# Patient Record
Sex: Female | Born: 1970 | Race: White | Hispanic: No | Marital: Married | Smoking: Never smoker
Health system: Southern US, Community
[De-identification: ages and names within clinical notes are randomized; demographics above are authoritative.]

## PROBLEM LIST (undated history)

## (undated) DIAGNOSIS — I499 Cardiac arrhythmia, unspecified: Secondary | ICD-10-CM

## (undated) DIAGNOSIS — K5790 Diverticulosis of intestine, part unspecified, without perforation or abscess without bleeding: Secondary | ICD-10-CM

## (undated) DIAGNOSIS — I10 Essential (primary) hypertension: Secondary | ICD-10-CM

## (undated) HISTORY — PX: WISDOM TOOTH EXTRACTION: SHX21

## (undated) HISTORY — DX: Diverticulosis of intestine, part unspecified, without perforation or abscess without bleeding: K57.90

---
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
## 1999-08-17 ENCOUNTER — Emergency Department (HOSPITAL_COMMUNITY): Admission: EM | Admit: 1999-08-17 | Discharge: 1999-08-17 | Payer: Self-pay | Admitting: Emergency Medicine

## 2000-02-22 DIAGNOSIS — I10 Essential (primary) hypertension: Secondary | ICD-10-CM

## 2000-02-22 HISTORY — DX: Essential (primary) hypertension: I10

## 2000-03-30 ENCOUNTER — Encounter: Payer: Self-pay | Admitting: Obstetrics & Gynecology

## 2000-03-30 ENCOUNTER — Encounter: Admission: RE | Admit: 2000-03-30 | Discharge: 2000-03-30 | Payer: Self-pay | Admitting: Obstetrics & Gynecology

## 2000-06-02 ENCOUNTER — Inpatient Hospital Stay (HOSPITAL_COMMUNITY): Admission: AD | Admit: 2000-06-02 | Discharge: 2000-06-04 | Payer: Self-pay | Admitting: Obstetrics and Gynecology

## 2000-07-19 ENCOUNTER — Other Ambulatory Visit: Admission: RE | Admit: 2000-07-19 | Discharge: 2000-07-19 | Payer: Self-pay | Admitting: Obstetrics & Gynecology

## 2001-08-14 ENCOUNTER — Other Ambulatory Visit: Admission: RE | Admit: 2001-08-14 | Discharge: 2001-08-14 | Payer: Self-pay | Admitting: Obstetrics & Gynecology

## 2001-08-17 ENCOUNTER — Encounter: Admission: RE | Admit: 2001-08-17 | Discharge: 2001-08-17 | Payer: Self-pay | Admitting: Obstetrics & Gynecology

## 2001-08-17 ENCOUNTER — Encounter: Payer: Self-pay | Admitting: Obstetrics & Gynecology

## 2001-12-03 ENCOUNTER — Other Ambulatory Visit: Admission: RE | Admit: 2001-12-03 | Discharge: 2001-12-03 | Payer: Self-pay | Admitting: Obstetrics & Gynecology

## 2002-06-14 ENCOUNTER — Inpatient Hospital Stay (HOSPITAL_COMMUNITY): Admission: AD | Admit: 2002-06-14 | Discharge: 2002-06-16 | Payer: Self-pay | Admitting: Obstetrics & Gynecology

## 2002-07-23 ENCOUNTER — Other Ambulatory Visit: Admission: RE | Admit: 2002-07-23 | Discharge: 2002-07-23 | Payer: Self-pay | Admitting: Obstetrics & Gynecology

## 2003-01-01 ENCOUNTER — Other Ambulatory Visit: Admission: RE | Admit: 2003-01-01 | Discharge: 2003-01-01 | Payer: Self-pay | Admitting: Obstetrics & Gynecology

## 2003-03-12 ENCOUNTER — Other Ambulatory Visit: Admission: RE | Admit: 2003-03-12 | Discharge: 2003-03-12 | Payer: Self-pay | Admitting: Obstetrics & Gynecology

## 2003-07-18 ENCOUNTER — Other Ambulatory Visit: Admission: RE | Admit: 2003-07-18 | Discharge: 2003-07-18 | Payer: Self-pay | Admitting: Obstetrics & Gynecology

## 2003-12-16 ENCOUNTER — Other Ambulatory Visit: Admission: RE | Admit: 2003-12-16 | Discharge: 2003-12-16 | Payer: Self-pay | Admitting: Obstetrics & Gynecology

## 2004-05-26 ENCOUNTER — Other Ambulatory Visit: Admission: RE | Admit: 2004-05-26 | Discharge: 2004-05-26 | Payer: Self-pay | Admitting: Obstetrics & Gynecology

## 2004-11-17 ENCOUNTER — Other Ambulatory Visit: Admission: RE | Admit: 2004-11-17 | Discharge: 2004-11-17 | Payer: Self-pay | Admitting: Obstetrics & Gynecology

## 2005-04-28 ENCOUNTER — Other Ambulatory Visit: Admission: RE | Admit: 2005-04-28 | Discharge: 2005-04-28 | Payer: Self-pay | Admitting: Obstetrics & Gynecology

## 2005-11-14 ENCOUNTER — Inpatient Hospital Stay (HOSPITAL_COMMUNITY): Admission: AD | Admit: 2005-11-14 | Discharge: 2005-11-16 | Payer: Self-pay | Admitting: Obstetrics and Gynecology

## 2014-04-18 ENCOUNTER — Ambulatory Visit (INDEPENDENT_AMBULATORY_CARE_PROVIDER_SITE_OTHER): Payer: 59 | Admitting: Internal Medicine

## 2014-04-18 VITALS — BP 110/68 | HR 50 | Temp 98.0°F | Resp 18 | Ht 64.5 in | Wt 135.0 lb

## 2014-04-18 DIAGNOSIS — J988 Other specified respiratory disorders: Secondary | ICD-10-CM

## 2014-04-18 DIAGNOSIS — J22 Unspecified acute lower respiratory infection: Secondary | ICD-10-CM

## 2014-04-18 MED ORDER — ALBUTEROL SULFATE HFA 108 (90 BASE) MCG/ACT IN AERS: 2.0000 | INHALATION_SPRAY | Freq: Four times a day (QID) | RESPIRATORY_TRACT | Status: DC | PRN

## 2014-04-18 MED ORDER — AZITHROMYCIN 250 MG PO TABS: ORAL_TABLET | ORAL | Status: DC

## 2014-04-18 MED ORDER — HYDROCODONE-HOMATROPINE 5-1.5 MG/5ML PO SYRP: 5.0000 mL | ORAL_SOLUTION | Freq: Four times a day (QID) | ORAL | Status: DC | PRN

## 2014-04-18 NOTE — Progress Notes (Signed)
   Subjective:    Patient ID: Linda Huff, female    DOB: 1970-10-09, 44 y.o.   MRN: [id number] This chart was scribed for Ellamae Siaobert Jeselle Hiser, MD by Jolene Provostobert Halas, Medical Scribe. This patient was seen in Room 2 and the patient's care was started a 2:51 PM.  Chief Complaint  Patient presents with  . Cough    x1 week   . Nasal Congestion  . Fever  . Generalized Body Aches    HPI HPI Comments: Linda Huff is a 44 y.o. female who [facility] complaining of a productive cough for the last week with associated chest congestion, fever, chills, sick contacts, diaphoresis and body aches. Running has not induced her to cough. Pt states her cough wakes her up at night.  Pt states she is training for a half marathon. Mid-March.  No history of asthma   Pt recently started working at a middle school.   Review of Systems  Constitutional: Positive for fever, chills and diaphoresis.  HENT: Positive for congestion.   Respiratory: Positive for cough.   Psychiatric/Behavioral: Positive for sleep disturbance.       Objective:   Physical Exam  Constitutional: She is oriented to person, place, and time. She appears well-developed and well-nourished. No distress.  HENT:  Head: Normocephalic and atraumatic.  Mouth/Throat: Oropharynx is clear and moist.  Eyes: Pupils are equal, round, and reactive to light.  Neck: Neck supple.  Cardiovascular: Normal rate and normal heart sounds.   Pulmonary/Chest: Effort normal. She has no wheezes.  Crackles at both bases that clear with deep breathing. No wheezing with forced expiration.   Musculoskeletal: Normal range of motion.  Neurological: She is alert and oriented to person, place, and time. Coordination normal.  Skin: Skin is warm and dry. She is not diaphoretic.  Psychiatric: She has a normal mood and affect. Her behavior is normal.  Nursing note and vitals reviewed.      Assessment & Plan:  LRI w/ RAD  Meds ordered this encounter    Medications  . azithromycin (ZITHROMAX) 250 MG tablet    Sig: As packaged    Dispense:  6 tablet    Refill:  0  . HYDROcodone-homatropine (HYCODAN) 5-1.5 MG/5ML syrup    Sig: Take 5 mLs by mouth every 6 (six) hours as needed.    Dispense:  120 mL    Refill:  0  . albuterol (PROVENTIL HFA;VENTOLIN HFA) 108 (90 BASE) MCG/ACT inhaler    Sig: Inhale 2 puffs into the lungs every 6 (six) hours as needed for wheezing or shortness of breath.    Dispense:  1 Inhaler    Refill:  0     I have completed the patient encounter in its entirety as documented by the scribe, with editing by me where necessary. Dorna Mallet P. Merla Richesoolittle, M.D.

## 2014-06-17 DIAGNOSIS — R001 Bradycardia, unspecified: Secondary | ICD-10-CM | POA: Insufficient documentation

## 2014-06-25 ENCOUNTER — Telehealth: Payer: Self-pay | Admitting: Cardiology

## 2014-06-25 NOTE — Telephone Encounter (Signed)
[facility] appointment on 08/07/14 with Dr Antoine PocheHochrein.  [facility] for Dr Hochrein's schedule on 08/07/14. lp

## 2014-07-22 ENCOUNTER — Ambulatory Visit: Payer: 59

## 2014-07-22 ENCOUNTER — Ambulatory Visit (INDEPENDENT_AMBULATORY_CARE_PROVIDER_SITE_OTHER): Payer: 59 | Admitting: Internal Medicine

## 2014-07-22 VITALS — BP 114/64 | HR 66 | Temp 97.0°F | Resp 16 | Ht 64.0 in | Wt 135.8 lb

## 2014-07-22 DIAGNOSIS — M25572 Pain in left ankle and joints of left foot: Secondary | ICD-10-CM

## 2014-07-22 NOTE — Progress Notes (Signed)
   Subjective:    Patient ID: Linda Huff, female    DOB: 02/11/1971, 44 y.o.   MRN: [id number] This chart was scribed for Linda Siaobert Jeovany Huitron, MD by Linda Huff, Medical Scribe. This patient was seen in Room 1 and the patient's care was started a 2:30 PM.  Chief Complaint  Patient presents with  . Foot Injury    x 5 days, Happened while running, started hurting after jog    HPI HPI Comments: Linda Huff is a 44 y.o. female who [facility] complaining of left foot pain on the lateral side that started while she was running five days ago. Pt has no pain while she is sleeping, but as soon as she puts pressure on it she feels pain. Pt denies doing any excessive activity in the three days preceding the onset of her pain.     Review of Systems  Constitutional: Negative for fever and chills.  Musculoskeletal: Positive for gait problem. Negative for joint swelling.  Skin: Negative for color change and wound.       Objective:   Physical Exam  Constitutional: She is oriented to person, place, and time. She appears well-developed and well-nourished. No distress.  HENT:  Head: Normocephalic and atraumatic.  Eyes: Pupils are equal, round, and reactive to light.  Neck: Neck supple.  Cardiovascular: Normal rate.   Pulmonary/Chest: Effort normal. No respiratory distress.  Musculoskeletal: Normal range of motion. She exhibits tenderness. She exhibits no edema.  Tender over the left lateral dorsal forefoot.   Neurological: She is alert and oriented to person, place, and time. Coordination normal.  Skin: Skin is warm and dry. She is not diaphoretic.  Psychiatric: She has a normal mood and affect. Her behavior is normal.  Nursing note and vitals reviewed.      Assessment & Plan:  Diagnosis probable peroneal tendinitis but ordered x-ray to rule out stress fracture due to distance running   Patient left before x-ray called her I asked the staff to call with instructions for  treatment of perineal tendinitis and follow-up in 2-3 weeks if not well but since she left without the conclusion I will not submit an office charge I have completed the patient encounter in its entirety as documented by the scribe, with editing by me where necessary. Aizlynn Digilio P. Merla Richesoolittle, M.D.

## 2014-08-07 ENCOUNTER — Ambulatory Visit: Payer: Self-pay | Admitting: Cardiology

## 2014-09-12 ENCOUNTER — Encounter: Payer: Self-pay | Admitting: Cardiology

## 2014-09-12 ENCOUNTER — Ambulatory Visit (INDEPENDENT_AMBULATORY_CARE_PROVIDER_SITE_OTHER): Payer: 59 | Admitting: Cardiology

## 2014-09-12 VITALS — BP 108/72 | HR 45 | Ht 64.0 in | Wt 136.0 lb

## 2014-09-12 DIAGNOSIS — R001 Bradycardia, unspecified: Secondary | ICD-10-CM | POA: Insufficient documentation

## 2014-09-12 DIAGNOSIS — I499 Cardiac arrhythmia, unspecified: Secondary | ICD-10-CM

## 2014-09-12 NOTE — Patient Instructions (Signed)
Your physician recommends that you schedule a follow-up appointment in: As Needed    

## 2014-09-12 NOTE — Progress Notes (Signed)
Cardiology Office Note   Date:  09/12/2014   ID:  [name], DOB 1971-01-18, MRN [id number]  PCP:  Herb Grays, MD  Cardiologist:   Rollene Rotunda, MD   Chief Complaint  Patient presents with  . Bradycardia      History of Present Illness: Linda Huff is a 44 y.o. female who presents for evaluation of slow heart rate and arrhythmia. The patient has had no cardiac history. She exercises routinely. She can run and play tennis without any cardiovascular symptoms. She denies any presyncope or syncope. She has no chest pressure, neck or arm discomfort. She's had no weight gain or edema. She has been noted to have slow heart rhythm and occasional "skipped beats". However, her heart rate goes up appropriately with exercise. She doesn't feel any palpitations.   PMH None  Past Surgical History  Procedure Laterality Date  . Wisdom tooth extraction       No current outpatient prescriptions on file.   No current facility-administered medications for this visit.    Allergies:   Review of patient's allergies indicates no known allergies.    Social History:  The patient  reports that she has never smoked. She does not have any smokeless tobacco history on file. She reports that she drinks about 1.8 oz of alcohol per week. She reports that she does not use illicit drugs.   Family History:  The patient's family history includes Alcohol abuse in her father; Hyperlipidemia in her mother; Lung cancer in her paternal grandfather; Stomach cancer in her maternal grandfather.    ROS:  Please see the history of present illness.   Otherwise, review of systems are positive for none.   All other systems are reviewed and negative.    PHYSICAL EXAM: VS:  BP 108/72 mmHg  Pulse 45  Ht  (1.626 m)  Wt 136 lb (61.689 kg)  BMI 23.33 kg/m2 , BMI Body mass index is 23.33 kg/(m^2). GENERAL:  Well appearing HEENT:  Pupils equal round and reactive, fundi not visualized, oral mucosa  unremarkable NECK:  No jugular venous distention, waveform within normal limits, carotid upstroke brisk and symmetric, no bruits, no thyromegaly LYMPHATICS:  No cervical, inguinal adenopathy LUNGS:  Clear to auscultation bilaterally BACK:  No CVA tenderness CHEST:  Unremarkable HEART:  PMI not displaced or sustained,S1 and S2 within normal limits, no S3, no S4, no clicks, no rubs, no murmurs ABD:  Flat, positive bowel sounds normal in frequency in pitch, no bruits, no rebound, no guarding, no midline pulsatile mass, no hepatomegaly, no splenomegaly EXT:  2 plus pulses throughout, no edema, no cyanosis no clubbing SKIN:  No rashes no nodules NEURO:  Cranial nerves II through XII grossly intact, motor grossly intact throughout PSYCH:  Cognitively intact, oriented to person place and time    EKG:  EKG is ordered today. The ekg ordered today demonstrates sinus bradycardia, rate 45, axis within normal limits, intervals within normal limits, poor anterior R wave progression secondary to lead placement, RSR prime V1 and V2   Recent Labs: No results found for requested labs within last 365 days.    Lipid Panel No results found for: CHOL, TRIG, HDL, CHOLHDL, VLDL, LDLCALC, LDLDIRECT    Wt Readings from Last 3 Encounters:  09/12/14 136 lb (61.689 kg)  07/22/14 135 lb 12.8 oz (61.598 kg)  04/18/14 135 lb (61.236 kg)      Other studies Reviewed: Additional studies/ records that were reviewed today include: Office records.  Review of the above records demonstrates:  Please see elsewhere in the note.     ASSESSMENT AND PLAN:  BRADYCARDIA:  This is related to her physical conditioning training. She has appropriate chronotropic response by history. She has no symptoms. This would not be related to obstructive coronary disease in the pretest probability of this is so low that further cardiovascular testing is not indicated. We talked about symptoms would develop should she have more  symptomatic bradycardia arrhythmias in the future.  ARRHYTHMIA:  Patient has occasional skipped beats but no symptoms related to this. These probably represent PACs. No further imaging is indicated.   Current medicines are reviewed at length with the patient today.  The patient does not have concerns regarding medicines.  The following changes have been made:  no change  Labs/ tests ordered today include:   Orders Placed This Encounter  Procedures  . EKG 12-Lead     Disposition:   FU with me as needed.     Signed, Rollene Rotunda, MD  09/12/2014 1:19 PM    [facility]

## 2014-09-19 ENCOUNTER — Encounter: Payer: Self-pay | Admitting: Cardiology

## 2018-08-21 ENCOUNTER — Other Ambulatory Visit: Payer: Self-pay

## 2018-08-21 ENCOUNTER — Inpatient Hospital Stay (HOSPITAL_COMMUNITY)
Admission: EM | Admit: 2018-08-21 | Discharge: 2018-08-27 | DRG: 329 | Disposition: A | Discharge status: Home / Self Care | Payer: BC Managed Care – PPO | Attending: Physician Assistant | Admitting: Physician Assistant

## 2018-08-21 ENCOUNTER — Encounter (HOSPITAL_COMMUNITY): Admission: EM | Disposition: A | Discharge status: Home / Self Care | Payer: Self-pay | Source: Home / Self Care

## 2018-08-21 ENCOUNTER — Emergency Department (HOSPITAL_COMMUNITY): Payer: BC Managed Care – PPO | Admitting: Anesthesiology

## 2018-08-21 ENCOUNTER — Encounter (HOSPITAL_COMMUNITY): Payer: Self-pay | Admitting: Emergency Medicine

## 2018-08-21 ENCOUNTER — Emergency Department (HOSPITAL_COMMUNITY): Payer: BC Managed Care – PPO

## 2018-08-21 DIAGNOSIS — Z9889 Other specified postprocedural states: Secondary | ICD-10-CM

## 2018-08-21 DIAGNOSIS — Z1159 Encounter for screening for other viral diseases: Secondary | ICD-10-CM | POA: Diagnosis not present

## 2018-08-21 DIAGNOSIS — K659 Peritonitis, unspecified: Secondary | ICD-10-CM

## 2018-08-21 DIAGNOSIS — R198 Other specified symptoms and signs involving the digestive system and abdomen: Secondary | ICD-10-CM

## 2018-08-21 DIAGNOSIS — K5720 Diverticulitis of large intestine with perforation and abscess without bleeding: Principal | ICD-10-CM | POA: Diagnosis present

## 2018-08-21 HISTORY — PX: LAPAROTOMY: SHX154

## 2018-08-21 HISTORY — DX: Cardiac arrhythmia, unspecified: I49.9

## 2018-08-21 HISTORY — DX: Other specified postprocedural states: Z98.890

## 2018-08-21 LAB — URINALYSIS, ROUTINE W REFLEX MICROSCOPIC
Bilirubin Urine: NEGATIVE
Glucose, UA: NEGATIVE mg/dL
Ketones, ur: NEGATIVE mg/dL
Leukocytes,Ua: NEGATIVE
Nitrite: NEGATIVE
Protein, ur: NEGATIVE mg/dL
Specific Gravity, Urine: 1.019 (ref 1.005–1.030)
pH: 5 (ref 5.0–8.0)

## 2018-08-21 LAB — CBC
HCT: 40.1 % (ref 36.0–46.0)
Hemoglobin: 13.4 g/dL (ref 12.0–15.0)
MCH: 28.8 pg (ref 26.0–34.0)
MCHC: 33.4 g/dL (ref 30.0–36.0)
MCV: 86.1 fL (ref 80.0–100.0)
Platelets: 332 10*3/uL (ref 150–400)
RBC: 4.66 MIL/uL (ref 3.87–5.11)
RDW: 13.7 % (ref 11.5–15.5)
WBC: 8.5 10*3/uL (ref 4.0–10.5)
nRBC: 0 % (ref 0.0–0.2)

## 2018-08-21 LAB — COMPREHENSIVE METABOLIC PANEL
ALT: 18 U/L (ref 0–44)
AST: 21 U/L (ref 15–41)
Albumin: 3.5 g/dL (ref 3.5–5.0)
Alkaline Phosphatase: 63 U/L (ref 38–126)
Anion gap: 9 (ref 5–15)
BUN: 10 mg/dL (ref 6–20)
CO2: 23 mmol/L (ref 22–32)
Calcium: 8.9 mg/dL (ref 8.9–10.3)
Chloride: 103 mmol/L (ref 98–111)
Creatinine, Ser: 0.82 mg/dL (ref 0.44–1.00)
GFR calc Af Amer: >60 mL/min (ref >60)
GFR calc non Af Amer: >60 mL/min (ref >60)
Glucose, Bld: 125 mg/dL — ABNORMAL HIGH (ref 70–99)
Potassium: 3.5 mmol/L (ref 3.5–5.1)
Sodium: 135 mmol/L (ref 135–145)
Total Bilirubin: 1.2 mg/dL (ref 0.3–1.2)
Total Protein: 6.7 g/dL (ref 6.5–8.1)

## 2018-08-21 LAB — LIPASE, BLOOD: Lipase: 22 U/L (ref 11–51)

## 2018-08-21 LAB — SARS CORONAVIRUS 2 BY RT PCR (HOSPITAL ORDER, PERFORMED IN ~~LOC~~ HOSPITAL LAB): SARS Coronavirus 2: NEGATIVE

## 2018-08-21 LAB — I-STAT BETA HCG BLOOD, ED (MC, WL, AP ONLY): I-stat hCG, quantitative: <5 m[IU]/mL (ref <5)

## 2018-08-21 SURGERY — LAPAROTOMY, EXPLORATORY
Anesthesia: General | Site: Abdomen

## 2018-08-21 MED ORDER — SUGAMMADEX SODIUM 200 MG/2ML IV SOLN
INTRAVENOUS | Status: DC | PRN
  Administered 2018-08-21: 200 mg via INTRAVENOUS

## 2018-08-21 MED ORDER — PIPERACILLIN-TAZOBACTAM 3.375 G IVPB 30 MIN
3.3750 g | Freq: Once | INTRAVENOUS | Status: AC
  Administered 2018-08-21: 20:00:00 3.375 g via INTRAVENOUS
  Filled 2018-08-21: qty 50

## 2018-08-21 MED ORDER — FENTANYL CITRATE (PF) 250 MCG/5ML IJ SOLN
INTRAMUSCULAR | Status: AC
  Filled 2018-08-21: qty 5

## 2018-08-21 MED ORDER — MORPHINE SULFATE (PF) 4 MG/ML IV SOLN
4.0000 mg | Freq: Once | INTRAVENOUS | Status: AC
  Administered 2018-08-21: 17:00:00 4 mg via INTRAVENOUS
  Filled 2018-08-21: qty 1

## 2018-08-21 MED ORDER — PHENYLEPHRINE 40 MCG/ML (10ML) SYRINGE FOR IV PUSH (FOR BLOOD PRESSURE SUPPORT)
PREFILLED_SYRINGE | INTRAVENOUS | Status: DC | PRN
  Administered 2018-08-21: 80 ug via INTRAVENOUS

## 2018-08-21 MED ORDER — ROCURONIUM BROMIDE 50 MG/5ML IV SOSY
PREFILLED_SYRINGE | INTRAVENOUS | Status: DC | PRN
  Administered 2018-08-21: 20 mg via INTRAVENOUS
  Administered 2018-08-21: 50 mg via INTRAVENOUS

## 2018-08-21 MED ORDER — PROPOFOL 10 MG/ML IV BOLUS
INTRAVENOUS | Status: AC
  Filled 2018-08-21: qty 20

## 2018-08-21 MED ORDER — 0.9 % SODIUM CHLORIDE (POUR BTL) OPTIME
TOPICAL | Status: DC | PRN
  Administered 2018-08-21: 2000 mL

## 2018-08-21 MED ORDER — ARTIFICIAL TEARS OPHTHALMIC OINT
TOPICAL_OINTMENT | OPHTHALMIC | Status: DC | PRN
  Administered 2018-08-21: 1 via OPHTHALMIC

## 2018-08-21 MED ORDER — LACTATED RINGERS IV SOLN
INTRAVENOUS | Status: DC | PRN
  Administered 2018-08-21 (×2): via INTRAVENOUS

## 2018-08-21 MED ORDER — EPHEDRINE 5 MG/ML INJ
INTRAVENOUS | Status: AC
  Filled 2018-08-21: qty 10

## 2018-08-21 MED ORDER — ONDANSETRON HCL 4 MG/2ML IJ SOLN
INTRAMUSCULAR | Status: AC
  Filled 2018-08-21: qty 2

## 2018-08-21 MED ORDER — SUCCINYLCHOLINE CHLORIDE 20 MG/ML IJ SOLN
INTRAMUSCULAR | Status: DC | PRN
  Administered 2018-08-21: 100 mg via INTRAVENOUS

## 2018-08-21 MED ORDER — LIDOCAINE 2% (20 MG/ML) 5 ML SYRINGE
INTRAMUSCULAR | Status: AC
  Filled 2018-08-21: qty 5

## 2018-08-21 MED ORDER — ONDANSETRON HCL 4 MG/2ML IJ SOLN
INTRAMUSCULAR | Status: DC | PRN
  Administered 2018-08-21: 4 mg via INTRAVENOUS

## 2018-08-21 MED ORDER — SODIUM CHLORIDE 0.9% FLUSH
3.0000 mL | Freq: Once | INTRAVENOUS | Status: AC
  Administered 2018-08-21: 17:00:00 3 mL via INTRAVENOUS

## 2018-08-21 MED ORDER — SODIUM CHLORIDE (PF) 0.9 % IJ SOLN
INTRAMUSCULAR | Status: AC
  Filled 2018-08-21: qty 10

## 2018-08-21 MED ORDER — HYDROMORPHONE HCL 1 MG/ML IJ SOLN
1.0000 mg | Freq: Once | INTRAMUSCULAR | Status: AC
  Administered 2018-08-21: 19:00:00 1 mg via INTRAVENOUS
  Filled 2018-08-21: qty 1

## 2018-08-21 MED ORDER — SUCCINYLCHOLINE CHLORIDE 200 MG/10ML IV SOSY
PREFILLED_SYRINGE | INTRAVENOUS | Status: AC
  Filled 2018-08-21: qty 10

## 2018-08-21 MED ORDER — 0.9 % SODIUM CHLORIDE (POUR BTL) OPTIME
TOPICAL | Status: DC | PRN
  Administered 2018-08-21: 21:00:00 3000 mL

## 2018-08-21 MED ORDER — LIDOCAINE HCL (CARDIAC) PF 100 MG/5ML IV SOSY
PREFILLED_SYRINGE | INTRAVENOUS | Status: DC | PRN
  Administered 2018-08-21: 100 mg via INTRAVENOUS

## 2018-08-21 MED ORDER — IOHEXOL 300 MG/ML  SOLN
100.0000 mL | Freq: Once | INTRAMUSCULAR | Status: AC | PRN
  Administered 2018-08-21: 18:00:00 100 mL via INTRAVENOUS

## 2018-08-21 MED ORDER — MIDAZOLAM HCL 5 MG/5ML IJ SOLN
INTRAMUSCULAR | Status: DC | PRN
  Administered 2018-08-21: 2 mg via INTRAVENOUS

## 2018-08-21 MED ORDER — ARTIFICIAL TEARS OPHTHALMIC OINT
TOPICAL_OINTMENT | OPHTHALMIC | Status: AC
  Filled 2018-08-21: qty 3.5

## 2018-08-21 MED ORDER — ONDANSETRON HCL 4 MG/2ML IJ SOLN
4.0000 mg | Freq: Once | INTRAMUSCULAR | Status: AC
  Administered 2018-08-21: 17:00:00 4 mg via INTRAVENOUS
  Filled 2018-08-21: qty 2

## 2018-08-21 MED ORDER — ROCURONIUM BROMIDE 10 MG/ML (PF) SYRINGE
PREFILLED_SYRINGE | INTRAVENOUS | Status: AC
  Filled 2018-08-21: qty 10

## 2018-08-21 MED ORDER — PHENYLEPHRINE 40 MCG/ML (10ML) SYRINGE FOR IV PUSH (FOR BLOOD PRESSURE SUPPORT)
PREFILLED_SYRINGE | INTRAVENOUS | Status: AC
  Filled 2018-08-21: qty 10

## 2018-08-21 MED ORDER — MIDAZOLAM HCL 2 MG/2ML IJ SOLN
INTRAMUSCULAR | Status: AC
  Filled 2018-08-21: qty 2

## 2018-08-21 MED ORDER — SODIUM CHLORIDE 0.9 % IV SOLN
Freq: Once | INTRAVENOUS | Status: AC
  Administered 2018-08-21: 17:00:00 via INTRAVENOUS

## 2018-08-21 MED ORDER — FENTANYL CITRATE (PF) 100 MCG/2ML IJ SOLN
INTRAMUSCULAR | Status: DC | PRN
  Administered 2018-08-21 (×4): 50 ug via INTRAVENOUS
  Administered 2018-08-21: 100 ug via INTRAVENOUS
  Administered 2018-08-21: 50 ug via INTRAVENOUS

## 2018-08-21 MED ORDER — PROPOFOL 10 MG/ML IV BOLUS
INTRAVENOUS | Status: DC | PRN
  Administered 2018-08-21: 160 mg via INTRAVENOUS

## 2018-08-21 MED ORDER — 0.9 % SODIUM CHLORIDE (POUR BTL) OPTIME
TOPICAL | Status: DC | PRN
  Administered 2018-08-21: 23:00:00 3000 mL

## 2018-08-21 SURGICAL SUPPLY — 47 items
APL PRP STRL LF DISP 70% ISPRP (MISCELLANEOUS) ×1
BLADE CLIPPER SURG (BLADE) IMPLANT
CANISTER SUCT 3000ML PPV (MISCELLANEOUS) ×3 IMPLANT
CANISTER WOUNDNEG PRESSURE 500 (CANNISTER) ×2 IMPLANT
CHLORAPREP W/TINT 26 (MISCELLANEOUS) ×3 IMPLANT
COVER SURGICAL LIGHT HANDLE (MISCELLANEOUS) ×3 IMPLANT
COVER WAND RF STERILE (DRAPES) ×3 IMPLANT
DRAPE LAPAROSCOPIC ABDOMINAL (DRAPES) ×3 IMPLANT
DRAPE WARM FLUID 44X44 (DRAPES) ×3 IMPLANT
DRSG OPSITE POSTOP 4X10 (GAUZE/BANDAGES/DRESSINGS) IMPLANT
DRSG OPSITE POSTOP 4X8 (GAUZE/BANDAGES/DRESSINGS) IMPLANT
DRSG VAC ATS LRG SENSATRAC (GAUZE/BANDAGES/DRESSINGS) ×2 IMPLANT
ELECT BLADE 6.5 EXT (BLADE) ×2 IMPLANT
ELECT CAUTERY BLADE 6.4 (BLADE) ×4 IMPLANT
ELECT REM PT RETURN 9FT ADLT (ELECTROSURGICAL) ×3
ELECTRODE REM PT RTRN 9FT ADLT (ELECTROSURGICAL) ×1 IMPLANT
GLOVE BIO SURGEON STRL SZ7 (GLOVE) ×3 IMPLANT
GLOVE BIOGEL PI IND STRL 7.5 (GLOVE) ×1 IMPLANT
GLOVE BIOGEL PI INDICATOR 7.5 (GLOVE) ×2
GOWN STRL REUS W/ TWL LRG LVL3 (GOWN DISPOSABLE) ×2 IMPLANT
GOWN STRL REUS W/TWL LRG LVL3 (GOWN DISPOSABLE) ×9
KIT BASIN OR (CUSTOM PROCEDURE TRAY) ×3 IMPLANT
KIT OSTOMY DRAINABLE 2.75 STR (WOUND CARE) ×2 IMPLANT
KIT TURNOVER KIT B (KITS) ×3 IMPLANT
LEGGING LITHOTOMY PAIR STRL (DRAPES) ×2 IMPLANT
LIGASURE IMPACT 36 18CM CVD LR (INSTRUMENTS) IMPLANT
NS IRRIG 1000ML POUR BTL (IV SOLUTION) ×6 IMPLANT
PACK GENERAL/GYN (CUSTOM PROCEDURE TRAY) ×3 IMPLANT
PAD ARMBOARD 7.5X6 YLW CONV (MISCELLANEOUS) ×5 IMPLANT
PENCIL SMOKE EVACUATOR (MISCELLANEOUS) ×3 IMPLANT
RELOAD PROXIMATE 75MM BLUE (ENDOMECHANICALS) ×9 IMPLANT
RELOAD STAPLE 75 3.8 BLU REG (ENDOMECHANICALS) IMPLANT
SPECIMEN JAR LARGE (MISCELLANEOUS) IMPLANT
SPONGE LAP 18X18 RF (DISPOSABLE) ×2 IMPLANT
STAPLER GUN LINEAR PROX 60 (STAPLE) ×2 IMPLANT
STAPLER PROXIMATE 75MM BLUE (STAPLE) ×2 IMPLANT
STAPLER VISISTAT 35W (STAPLE) ×3 IMPLANT
SUCTION POOLE TIP (SUCTIONS) ×3 IMPLANT
SUT PDS AB 1 TP1 96 (SUTURE) ×6 IMPLANT
SUT SILK 2 0 SH CR/8 (SUTURE) ×3 IMPLANT
SUT SILK 2 0 TIES 10X30 (SUTURE) ×3 IMPLANT
SUT SILK 3 0 SH CR/8 (SUTURE) ×3 IMPLANT
SUT SILK 3 0 TIES 10X30 (SUTURE) ×3 IMPLANT
SUT VIC AB 3-0 SH 18 (SUTURE) ×2 IMPLANT
TOWEL GREEN STERILE (TOWEL DISPOSABLE) ×3 IMPLANT
TRAY FOLEY MTR SLVR 16FR STAT (SET/KITS/TRAYS/PACK) IMPLANT
YANKAUER SUCT BULB TIP NO VENT (SUCTIONS) ×2 IMPLANT

## 2018-08-21 NOTE — Anesthesia Preprocedure Evaluation (Addendum)
Anesthesia Evaluation  Patient identified by MRN, date of birth, ID band Patient awake    Reviewed: Allergy & Precautions, NPO status , Patient's Chart, lab work & pertinent test results  Airway Mallampati: I  TM Distance: >3 FB Neck ROM: Full    Dental  (+) Teeth Intact, Dental Advisory Given   Pulmonary neg pulmonary ROS,    breath sounds clear to auscultation       Cardiovascular negative cardio ROS   Rhythm:Regular Rate:Normal     Neuro/Psych negative neurological ROS  negative psych ROS   GI/Hepatic negative GI ROS, Neg liver ROS,   Endo/Other  negative endocrine ROS  Renal/GU negative Renal ROS     Musculoskeletal negative musculoskeletal ROS (+)   Abdominal Normal abdominal exam  (+)   Peds  Hematology negative hematology ROS (+)   Anesthesia Other Findings   Reproductive/Obstetrics                            Anesthesia Physical Anesthesia Plan  ASA: III and emergent  Anesthesia Plan: General   Post-op Pain Management:    Induction: Intravenous, Rapid sequence and Cricoid pressure planned  PONV Risk Score and Plan: 4 or greater and Ondansetron, Dexamethasone, Midazolam and Scopolamine patch - Pre-op  Airway Management Planned: Oral ETT  Additional Equipment: Arterial line, Ultrasound Guidance Line Placement and CVP  Intra-op Plan:   Post-operative Plan: Possible Post-op intubation/ventilation  Informed Consent: I have reviewed the patients History and Physical, chart, labs and discussed the procedure including the risks, benefits and alternatives for the proposed anesthesia with the patient or authorized representative who has indicated his/her understanding and acceptance.     Dental advisory given  Plan Discussed with: CRNA, Anesthesiologist and Surgeon  Anesthesia Plan Comments:       Anesthesia Quick Evaluation

## 2018-08-21 NOTE — Anesthesia Procedure Notes (Signed)
Procedure Name: Intubation Date/Time: 08/21/2018 10:01 PM Performed by: Clovis Cao, CRNA Pre-anesthesia Checklist: Patient identified, Emergency Drugs available, Suction available, Patient being monitored and Timeout performed Patient Re-evaluated:Patient Re-evaluated prior to induction Oxygen Delivery Method: Circle system utilized Preoxygenation: Pre-oxygenation with 100% oxygen Induction Type: IV induction, Rapid sequence and Cricoid Pressure applied Laryngoscope Size: Miller and 2 Grade View: Grade III Tube size: 8.0 mm Number of attempts: 1 Airway Equipment and Method: Stylet and Bougie stylet Placement Confirmation: ETT inserted through vocal cords under direct vision,  positive ETCO2 and breath sounds checked- equal and bilateral Secured at: 22 cm Tube secured with: Tape Dental Injury: Teeth and Oropharynx as per pre-operative assessment

## 2018-08-21 NOTE — ED Provider Notes (Signed)
[place] EMERGENCY DEPARTMENT Provider Note   CSN: [id number] Arrival date & time: 08/21/18  1417    History   Chief Complaint Chief Complaint  Patient presents with   Abdominal Cramping    HPI Linda Huff is a 48 y.o. female presents to the ER for evaluation of abdominal pain.  Described as cramping.  Onset on Thursday.  Initially mild but it has progressed and now severe 8-9/10.  She felt a little bit better yesterday but again this morning woke up and it was much worse.  Pain is constant, "generalized" but mostly in the lower quadrants. it radiates to her back, right slightly worse than left. This morning she had some bladder pressure and felt like it was gynecological so she made an appointment with her OB/GYN for tomorrow morning but the pain got significantly worse so she came to the ER.  Has had intermittent nausea and decreased appetite.  Initially thought it was related to constipation, she has taken Metamucil, MiraLAX, ibuprofen without relief.  She does not usually have issues with constipation and has had a daily bowel movement every day including today.  Has had some night sweats that are not necessarily new but no fever.  No vomiting, dysuria, hematuria, urinary frequency, diarrhea, melena, constipation.  She gets monthly periods and this month it was a few days late but when providing urinalysis here noticed some dark blood vaginal spotting. No travel. No exposure to COVID. No associated congestion, rhinorrhea, sore throat, cough, loss of taste/smell. No alleviating factors. Pain is worse with standing up straight, has to walk bent at the waist, deep breathing, moving, palpation.      HPI  History reviewed. No pertinent past medical history.  Patient Active Problem List   Diagnosis Date Noted   Bradycardia 09/12/2014    Past Surgical History:  Procedure Laterality Date   WISDOM TOOTH EXTRACTION       OB History   No obstetric history on  file.      Home Medications    Prior to Admission medications   Medication Sig Start Date End Date Taking? Authorizing Provider  ibuprofen (ADVIL) 200 MG tablet Take 200-400 mg by mouth every 6 (six) hours as needed for moderate pain.   Yes [provider]    Family History Family History  Problem Relation Age of Onset   Hyperlipidemia Mother    Alcohol abuse Father    Stomach cancer Maternal Grandfather    Lung cancer Paternal Grandfather     Social History Social History   Tobacco Use   Smoking status: Never Smoker   Smokeless tobacco: Never Used  Substance Use Topics   Alcohol use: Yes    Alcohol/week: 3.0 standard drinks    Types: 3 Standard drinks or equivalent per week   Drug use: No     Allergies   Patient has no known allergies.   Review of Systems Review of Systems  Constitutional: Positive for appetite change and diaphoresis.  Gastrointestinal: Positive for abdominal pain and nausea.  All other systems reviewed and are negative.    Physical Exam Updated Vital Signs BP (!) 105/50    Pulse 74    Temp 98.5 F (36.9 C) (Oral)    Resp 16    Ht 5\' 4"  (1.626 m)    Wt 66.7 kg    LMP 07/12/2018    SpO2 99%    BMI 25.23 kg/m   Physical Exam Vitals signs and nursing note reviewed.  Constitutional:      Appearance: She is well-developed.     Comments: Non toxic in NAD  HENT:     Head: Normocephalic and atraumatic.     Nose: Nose normal.  Eyes:     Conjunctiva/sclera: Conjunctivae normal.  Neck:     Musculoskeletal: Normal range of motion.  Cardiovascular:     Rate and Rhythm: Normal rate and regular rhythm.     Comments: 1+ radial and DP pulses bilaterally Pulmonary:     Effort: Pulmonary effort is normal.     Breath sounds: Normal breath sounds.  Abdominal:     General: Bowel sounds are normal.     Palpations: Abdomen is soft.     Tenderness: There is abdominal tenderness. There is guarding.     Comments: Generalized moderate  lower abd pain at LLQ, periumbilical and R mid abdomen. There is guarding. Pain with sitting up, taking deep breaths. Negative Prieto's. Negative McBurney's. Active BS to lower quadrants. Soft. No distention. No R/R. No CVAT.   Musculoskeletal: Normal range of motion.  Skin:    General: Skin is warm and dry.     Capillary Refill: Capillary refill takes less than 2 seconds.  Neurological:     Mental Status: She is alert.     Comments: Sensation to light touch and strength intact in upper and lower extremities bilaterally  Psychiatric:        Behavior: Behavior normal.      ED Treatments / Results  Labs (all labs ordered are listed, but only abnormal results are displayed) Labs Reviewed  COMPREHENSIVE METABOLIC PANEL - Abnormal; Notable for the following components:      Result Value   Glucose, Bld 125 (*)    All other components within normal limits  URINALYSIS, ROUTINE W REFLEX MICROSCOPIC - Abnormal; Notable for the following components:   APPearance HAZY (*)    Hgb urine dipstick SMALL (*)    Bacteria, UA RARE (*)    All other components within normal limits  CULTURE, BLOOD (ROUTINE X 2)  CULTURE, BLOOD (ROUTINE X 2)  SARS CORONAVIRUS 2 (HOSPITAL ORDER, PERFORMED IN [facility])  LIPASE, BLOOD  CBC  I-STAT BETA HCG BLOOD, ED ([facility], [facility], [facility] ONLY)    EKG None  Radiology Ct Abdomen Pelvis W Contrast  Result Date: 08/21/2018 CLINICAL DATA:  Lower abdominal pain EXAM: CT ABDOMEN AND PELVIS WITH CONTRAST TECHNIQUE: Multidetector CT imaging of the abdomen and pelvis was performed using the standard protocol following bolus administration of intravenous contrast. CONTRAST:  100mL OMNIPAQUE IOHEXOL 300 MG/ML  SOLN COMPARISON:  None FINDINGS: Lower chest: Subsegmental atelectasis is identified within the lung bases posteriorly. Hepatobiliary: No focal liver abnormality is seen. No gallstones, gallbladder wall thickening, or biliary dilatation. Pancreas: Unremarkable. No  pancreatic ductal dilatation or surrounding inflammatory changes. Spleen: Normal in size without focal abnormality. Adrenals/Urinary Tract: Normal adrenal glands. The kidneys are unremarkable. The urinary bladder appears normal. Stomach/Bowel: There are extensive inflammatory changes within the lower abdomen and pelvis including free fluid, fat stranding and bowel wall thickening. Multiple foci of interloop, extraluminal foci of gas compatible with perforation. Wall thickening involves right lower quadrant and pelvic small bowel loops as well as sigmoid colon and rectum. Multiple small foci of extraluminal gas identified between bowel loops. The largest of these is identified between sigmoid colon and a loop of ileum within the central pelvis measuring 1.8 cm, image 38/6. Scattered colonic diverticula noted. Within the cul-de-sac there is a peripherally  enhancing fluid collection which measures 5.7 by 2.9 by 5.4 cm. A normal appendix is not visualized. No findings identified to suggest bowel obstruction. Vascular/Lymphatic: Normal appearance of the abdominal aorta. No aneurysm. Small retroperitoneal lymph nodes are likely reactive. No pelvic or inguinal adenopathy identified. Reproductive: Uterus appears normal.  No adnexal mass identified. Other: Extensive scratch set free fluid extends along the right pericolic gutter. A small amount of fluid extends over the anterior right lobe of liver. Musculoskeletal: No aggressive lytic or sclerotic bone lesions. IMPRESSION: 1. Examination is remarkable for extensive inflammatory changes involving the lower abdomen and pelvis. There is diffuse fat stranding, free fluid and bowel wall thickening involving the small and large bowel loops within the lower abdomen and pelvis. Primary differential considerations include: Perforated sigmoid diverticulitis, active inflammatory bowel disease complicated by penetrating disease versus perforated appendicitis. 2. Evidence of interloop,  extraluminal gas compatible with perforated viscus. 3. Posterior pelvic fluid collection identified which is concerning for abscess. 4. Critical Value/emergent results were called by telephone at the time of interpretation on 08/21/2018 at 7:00 pm to Dr. Sharen HeckLAUDIA Magdalina Whitehead , who verbally acknowledged these results. Electronically Signed   By: Signa Kellaylor  Stroud M.D.   On: 08/21/2018 19:00    Procedures .Critical Care Performed by: Liberty HandyGibbons, Mairead Schwarzkopf J, PA-C Authorized by: Liberty HandyGibbons, Marlaina Coburn J, PA-C   Critical care provider statement:    Critical care time (minutes):  45   Critical care was necessary to treat or prevent imminent or life-threatening deterioration of the following conditions: extensive intraabdominal/pelvic inflammation, early abscess, perforated viscus, requiring multiple consults in ER, repeat evaluations, admission, IV abx, repeat pain control.   Critical care was time spent personally by me on the following activities:  Discussions with consultants, evaluation of patient's response to treatment, examination of patient, ordering and performing treatments and interventions, ordering and review of laboratory studies, ordering and review of radiographic studies, pulse oximetry, re-evaluation of patient's condition, obtaining history from patient or surrogate, review of old charts and development of treatment plan with patient or surrogate   I assumed direction of critical care for this patient from another provider in my specialty: no     (including critical care time)  Medications Ordered in ED Medications  piperacillin-tazobactam (ZOSYN) IVPB 3.375 g (has no administration in time range)  sodium chloride flush (NS) 0.9 % injection 3 mL (3 mLs Intravenous Given 08/21/18 1636)  morphine 4 MG/ML injection 4 mg (4 mg Intravenous Given 08/21/18 1637)  ondansetron (ZOFRAN) injection 4 mg (4 mg Intravenous Given 08/21/18 1636)  0.9 %  sodium chloride infusion ( Intravenous New Bag/Given 08/21/18 1636)   iohexol (OMNIPAQUE) 300 MG/ML solution 100 mL (100 mLs Intravenous Contrast Given 08/21/18 1815)  HYDROmorphone (DILAUDID) injection 1 mg (1 mg Intravenous Given 08/21/18 1905)     Initial Impression / Assessment and Plan / ED Course  I have reviewed the triage vital signs and the nursing notes.  Pertinent labs & imaging results that were available during my care of the patient were reviewed by me and considered in my medical decision making (see chart for details).  Clinical Course as of Aug 20 1924  Tue Aug 21, 2018  1615 Pt began menses today, noticed dark blood with wiping during urinalysis   Hgb urine dipstick(!): SMALL [CG]  1910 1. Examination is remarkable for extensive inflammatory changes involving the lower abdomen and pelvis. There is diffuse fat stranding, free fluid and bowel wall thickening involving the small and large bowel loops within the  lower abdomen and pelvis. Primary differential considerations include: Perforated sigmoid diverticulitis, active inflammatory bowel disease complicated by penetrating disease versus perforated appendicitis. 2. Evidence of interloop, extraluminal gas compatible with perforated viscus. 3. Posterior pelvic fluid collection identified which is concerning for abscess.  CT ABDOMEN PELVIS W CONTRAST [CG]    Clinical Course User Index [CG] Jerrell MylarGibbons, Avacyn Kloosterman J, PA-C    I reviewed patient's emergency room pertinent past medical history.    48 yo health F presents with moderate/severe lower abdominal pain. She reports previous diverticular disease.  No other abdominal surgeries. No fever. No vomiting. No urinary symptoms. No abnormal vaginal symptoms/bleeding. Not concerned for STDs, low risk sexual practices.  On exam she looks uncomfortable but nontoxic.  There is diffuse lower abdominal tenderness, guarding.  Pain is noted with position changes, deep breathing, movement.  She is holding her knees up to her chest for comfort.  DDX includes  diverticular complication such as diverticulitis versus abscess versus appendicitis versus SBO.  She has no urinary symptoms, flank pain and UTI/pyelonephritis is less likely.  She is young and has no risk factors for work ischemic bowel.  Lab work, UA obtained, reviewed and interpreted by me.  No leukocytosis.  LFTs, creatinine, electrolytes WNL.  UA with small hemoglobin but she started menses today.  I reevaluated patient a second time after morphine and Zofran and she had persistent, 9/10 abdominal tenderness, guarding and pain with movements.  Given persistent pain, CT A/P was obtained.  CT A/P reviewed and interpreted by me and radiologist.  Patient has extensive inflammatory changes in the lower abdomen pelvis, fluid collection concerning for abscess and interloop/extraluminal gas suspicious for perforated viscus.  Results discussed and explained to patient. Highest concern for diverticular complication although on exam she has mostly midline/RLQ/right mid abd tenderness.  I will initiate Zosyn, continue pain/nausea control, consult general surgery and admit to medicine.  1925: Spoke to Dr Gwinda Passesuie who will come see patient in ER. General surgery to admit. Pt has been updated on POC and plan for admission, she is agreeable. COVID test pending.  Final Clinical Impressions(s) / ED Diagnoses   Final diagnoses:  Perforated viscus  Peritonitis [facility](HCC)    ED Discharge Orders    None       Jerrell MylarGibbons, Jaidin Ugarte J, PA-C 08/21/18 1926    Terrilee FilesButler, Michael C, MD 08/22/18 [phone]

## 2018-08-21 NOTE — H&P (Signed)
Linda Huff is an 48 y.o. female.   Chief Complaint: Generalized abdominal pain HPI: This is a 48 year old female in excellent health who presents with a history of mild diverticulitis episodes.  She has had 5 or 6 episodes of the last several years.  She has never been treated with antibiotics.  She has always managed with a change in her diet during these brief attacks.  The diverticulosis was diagnosed on a colonoscopy at [facility] 5 years ago.  The indication for the colonoscopy was a positive Cologuard test.  We do not have the report but reportedly there were no other findings other than diverticulosis.  On 08/16/2018, she began experiencing some suprapubic pressure and tenderness.  She also experiencing abdominal bloating.  She has continued to have bowel movements but she states that her bowel movements are smaller.  She denies any gross blood in her bowel movements.  Her appetite has decreased.  She states that she has not been febrile.  No nausea or vomiting.  The pain has become more generalized over the last couple of days.  She is not tender all over her abdomen.  She finally reached the point where she came to the emergency department for evaluation.  COVID test is pending  History reviewed. No pertinent past medical history.  Past Surgical History:  Procedure Laterality Date  . WISDOM TOOTH EXTRACTION      Family History  Problem Relation Age of Onset  . Hyperlipidemia Mother   . Alcohol abuse Father   . Stomach cancer Maternal Grandfather   . Lung cancer Paternal Grandfather    Social History:  reports that she has never smoked. She has never used smokeless tobacco. She reports current alcohol use of about 3.0 standard drinks of alcohol per week. She reports that she does not use drugs.  Allergies: No Known Allergies  Prior to Admission medications   Medication Sig Start Date End Date Taking? Authorizing Provider  ibuprofen (ADVIL) 200 MG tablet Take 200-400 mg by mouth  every 6 (six) hours as needed for moderate pain.   Yes [provider]     Results for orders placed or performed during the hospital encounter of 08/21/18 (from the past 48 hour(s))  Lipase, blood     Status: None   Collection Time: 08/21/18  2:27 PM  Result Value Ref Range   Lipase 22 11 - 51 U/L    Comment: Performed at [facility], [address] [place], [place] [address]  Comprehensive metabolic panel     Status: Abnormal   Collection Time: 08/21/18  2:27 PM  Result Value Ref Range   Sodium 135 135 - 145 mmol/L   Potassium 3.5 3.5 - 5.1 mmol/L   Chloride 103 98 - 111 mmol/L   CO2 23 22 - 32 mmol/L   Glucose, Bld 125 (H) 70 - 99 mg/dL   BUN 10 6 - 20 mg/dL   Creatinine, Ser 6.040.82 0.44 - 1.00 mg/dL   Calcium 8.9 8.9 - 54.010.3 mg/dL   Total Protein 6.7 6.5 - 8.1 g/dL   Albumin 3.5 3.5 - 5.0 g/dL   AST 21 15 - 41 U/L   ALT 18 0 - 44 U/L   Alkaline Phosphatase 63 38 - 126 U/L   Total Bilirubin 1.2 0.3 - 1.2 mg/dL   GFR calc non Af Amer >60 >60 mL/min   GFR calc Af Amer >60 >60 mL/min   Anion gap 9 5 - 15  Comment: Performed at [facility], [place] [address] [place], [place] [address]  CBC     Status: None   Collection Time: 08/21/18  2:27 PM  Result Value Ref Range   WBC 8.5 4.0 - 10.5 K/uL   RBC 4.66 3.87 - 5.11 MIL/uL   Hemoglobin 13.4 12.0 - 15.0 g/dL   HCT 40.1 36.0 - 46.0 %   MCV 86.1 80.0 - 100.0 fL   MCH 28.8 26.0 - 34.0 pg   MCHC 33.4 30.0 - 36.0 g/dL   RDW 13.7 11.5 - 15.5 %   Platelets 332 150 - 400 K/uL   nRBC 0.0 0.0 - 0.2 %    Comment: Performed at [facility], [place] [address] [place], [place] [address]  Urinalysis, Routine w reflex microscopic     Status: Abnormal   Collection Time: 08/21/18  2:42 PM  Result Value Ref Range   Color, Urine YELLOW YELLOW   APPearance HAZY (A) CLEAR   Specific Gravity, Urine 1.019 1.005 - 1.030   pH 5.0 5.0 - 8.0   Glucose, UA NEGATIVE NEGATIVE mg/dL   Hgb urine dipstick SMALL (A) NEGATIVE    Bilirubin Urine NEGATIVE NEGATIVE   Ketones, ur NEGATIVE NEGATIVE mg/dL   Protein, ur NEGATIVE NEGATIVE mg/dL   Nitrite NEGATIVE NEGATIVE   Leukocytes,Ua NEGATIVE NEGATIVE   RBC / HPF 0-5 0 - 5 RBC/hpf   WBC, UA 0-5 0 - 5 WBC/hpf   Bacteria, UA RARE (A) NONE SEEN   Squamous Epithelial / LPF 0-5 0 - 5   Mucus PRESENT    Hyaline Casts, UA PRESENT     Comment: Performed at [facility], [address], [place], [place] [address]  I-Stat beta hCG blood, ED     Status: None   Collection Time: 08/21/18  3:22 PM  Result Value Ref Range   I-stat hCG, quantitative <5.0 <5 mIU/mL   Comment 3            Comment:   GEST. AGE      CONC.  (mIU/mL)   <=1 WEEK        5 - 50     2 WEEKS       50 - 500     3 WEEKS       100 - 10,000     4 WEEKS     1,000 - 30,000        FEMALE AND NON-PREGNANT FEMALE:     LESS THAN 5 mIU/mL    Ct Abdomen Pelvis W Contrast  Result Date: 08/21/2018 CLINICAL DATA:  Lower abdominal pain EXAM: CT ABDOMEN AND PELVIS WITH CONTRAST TECHNIQUE: Multidetector CT imaging of the abdomen and pelvis was performed using the standard protocol following bolus administration of intravenous contrast. CONTRAST:  187mL OMNIPAQUE IOHEXOL 300 MG/ML  SOLN COMPARISON:  None FINDINGS: Lower chest: Subsegmental atelectasis is identified within the lung bases posteriorly. Hepatobiliary: No focal liver abnormality is seen. No gallstones, gallbladder wall thickening, or biliary dilatation. Pancreas: Unremarkable. No pancreatic ductal dilatation or surrounding inflammatory changes. Spleen: Normal in size without focal abnormality. Adrenals/Urinary Tract: Normal adrenal glands. The kidneys are unremarkable. The urinary bladder appears normal. Stomach/Bowel: There are extensive inflammatory changes within the lower abdomen and pelvis including free fluid, fat stranding and bowel wall thickening. Multiple foci of interloop, extraluminal foci of gas compatible with perforation. Wall thickening involves  right lower quadrant and pelvic small bowel loops as well as sigmoid colon and  rectum. Multiple small foci of extraluminal gas identified between bowel loops. The largest of these is identified between sigmoid colon and a loop of ileum within the central pelvis measuring 1.8 cm, image 38/6. Scattered colonic diverticula noted. Within the cul-de-sac there is a peripherally enhancing fluid collection which measures 5.7 by 2.9 by 5.4 cm. A normal appendix is not visualized. No findings identified to suggest bowel obstruction. Vascular/Lymphatic: Normal appearance of the abdominal aorta. No aneurysm. Small retroperitoneal lymph nodes are likely reactive. No pelvic or inguinal adenopathy identified. Reproductive: Uterus appears normal.  No adnexal mass identified. Other: Extensive scratch set free fluid extends along the right pericolic gutter. A small amount of fluid extends over the anterior right lobe of liver. Musculoskeletal: No aggressive lytic or sclerotic bone lesions. IMPRESSION: 1. Examination is remarkable for extensive inflammatory changes involving the lower abdomen and pelvis. There is diffuse fat stranding, free fluid and bowel wall thickening involving the small and large bowel loops within the lower abdomen and pelvis. Primary differential considerations include: Perforated sigmoid diverticulitis, active inflammatory bowel disease complicated by penetrating disease versus perforated appendicitis. 2. Evidence of interloop, extraluminal gas compatible with perforated viscus. 3. Posterior pelvic fluid collection identified which is concerning for abscess. 4. Critical Value/emergent results were called by telephone at the time of interpretation on 08/21/2018 at 7:00 pm to Dr. Sharen HeckLAUDIA GIBBONS , who verbally acknowledged these results. Electronically Signed   By: Signa Kellaylor  Stroud M.D.   On: 08/21/2018 19:00    Review of Systems  Constitutional: Positive for diaphoresis. Negative for weight loss.  HENT:  Negative for ear discharge, ear pain, hearing loss and tinnitus.   Eyes: Negative for blurred vision, double vision, photophobia and pain.  Respiratory: Negative for cough, sputum production and shortness of breath.   Cardiovascular: Negative for chest pain.  Gastrointestinal: Positive for abdominal pain and nausea. Negative for vomiting.  Genitourinary: Negative for dysuria, flank pain, frequency and urgency.  Musculoskeletal: Negative for back pain, falls, joint pain, myalgias and neck pain.  Neurological: Negative for dizziness, tingling, sensory change, focal weakness, loss of consciousness and headaches.  Endo/Heme/Allergies: Does not bruise/bleed easily.  Psychiatric/Behavioral: Negative for depression, memory loss and substance abuse. The patient is not nervous/anxious.     Blood pressure (!) 109/55, pulse 82, temperature 98.5 F (36.9 C), temperature source Oral, resp. rate 16, height 5\' 4"  (1.626 m), weight 66.7 kg, last menstrual period 07/12/2018, SpO2 96 %. Physical Exam  WDWN in NAD Eyes:  Pupils equal, round; sclera anicteric HENT:  Oral mucosa moist; good dentition  Neck:  No masses palpated, no thyromegaly Lungs:  CTA bilaterally; normal respiratory effort CV:  Regular rate and rhythm; no murmurs; extremities well-perfused with no edema Abd:  Mildly distended, diffuse tenderness, worse in lower abdomen and suprapubic region; + guarding; no rebound Skin:  Warm, dry; no sign of jaundice Psychiatric - alert and oriented x 4; calm mood and affect    Assessment/Plan Peritonitis, probable perforated sigmoid diverticulitis with generalized free fluid/ inflammation/ intraloop abscesses/ pelvic abscess  Patient beginning to become febrile, mildly tachycardic  Will proceed urgently to the OR for exploratory laparotomy with possible bowel/colon resection.  The patient understands that she may have to have a temporary colostomy or loop ileostomy.  The surgical procedure has been  discussed with the patient.  Potential risks, benefits, alternative treatments, and expected outcomes have been explained.  All of the patient's questions at this time have been answered.  The likelihood of reaching the patient's treatment  goal is good.  The patient understand the proposed surgical procedure and wishes to proceed.   Wynona LunaMatthew K Read Bonelli, MD 08/21/2018, 8:22 PM

## 2018-08-21 NOTE — Anesthesia Procedure Notes (Addendum)
Arterial Line Insertion Start/End6/30/2020 10:15 PM, 08/21/2018 10:29 PM Performed by: Effie Berkshire, MD, anesthesiologist  Patient location: OR. Preanesthetic checklist: patient identified, IV checked, risks and benefits discussed, surgical consent, monitors and equipment checked, pre-op evaluation, timeout performed and anesthesia consent Right, radial was placed Catheter size: 20 G Maximum sterile barriers used   Attempts: 1 (attempt by [facility], CRNA x 2 on left side) Procedure performed without using ultrasound guided technique. Ultrasound Notes:anatomy identified and needle tip was noted to be adjacent to the nerve/plexus identified Following insertion, Biopatch and dressing applied. Patient tolerated the procedure well with no immediate complications.

## 2018-08-21 NOTE — ED Triage Notes (Signed)
Pt states she has been having generalized abd cramping since Thursday. Pt states it is increasing in severity. Complains of nausea, denies v/d.

## 2018-08-21 NOTE — ED Notes (Signed)
Patient transported to CT 

## 2018-08-22 ENCOUNTER — Other Ambulatory Visit: Payer: Self-pay

## 2018-08-22 ENCOUNTER — Encounter (HOSPITAL_COMMUNITY): Payer: Self-pay

## 2018-08-22 DIAGNOSIS — K5720 Diverticulitis of large intestine with perforation and abscess without bleeding: Secondary | ICD-10-CM

## 2018-08-22 HISTORY — DX: Diverticulitis of large intestine with perforation and abscess without bleeding: K57.20

## 2018-08-22 LAB — MRSA PCR SCREENING: MRSA by PCR: NEGATIVE

## 2018-08-22 MED ORDER — POTASSIUM CHLORIDE IN NACL 20-0.9 MEQ/L-% IV SOLN
INTRAVENOUS | Status: DC
  Administered 2018-08-22 (×3): via INTRAVENOUS
  Filled 2018-08-22 (×4): qty 1000

## 2018-08-22 MED ORDER — PIPERACILLIN-TAZOBACTAM 3.375 G IVPB
3.3750 g | Freq: Three times a day (TID) | INTRAVENOUS | Status: DC
  Administered 2018-08-22 – 2018-08-26 (×13): 3.375 g via INTRAVENOUS
  Filled 2018-08-22 (×17): qty 50

## 2018-08-22 MED ORDER — ACETAMINOPHEN 650 MG RE SUPP: 650.0000 mg | Freq: Four times a day (QID) | RECTAL | Status: DC | PRN

## 2018-08-22 MED ORDER — ENOXAPARIN SODIUM 40 MG/0.4ML ~~LOC~~ SOLN
40.0000 mg | SUBCUTANEOUS | Status: DC
  Administered 2018-08-23 – 2018-08-27 (×5): 40 mg via SUBCUTANEOUS
  Filled 2018-08-22 (×5): qty 0.4

## 2018-08-22 MED ORDER — PROMETHAZINE HCL 25 MG/ML IJ SOLN: 6.2500 mg | INTRAMUSCULAR | Status: DC | PRN

## 2018-08-22 MED ORDER — ACETAMINOPHEN 325 MG PO TABS: 650.0000 mg | ORAL_TABLET | Freq: Four times a day (QID) | ORAL | Status: DC | PRN

## 2018-08-22 MED ORDER — DIPHENHYDRAMINE HCL 50 MG/ML IJ SOLN: 25.0000 mg | Freq: Four times a day (QID) | INTRAMUSCULAR | Status: DC | PRN

## 2018-08-22 MED ORDER — HYDROMORPHONE HCL 1 MG/ML IJ SOLN
INTRAMUSCULAR | Status: AC
  Filled 2018-08-22: qty 1

## 2018-08-22 MED ORDER — ACETAMINOPHEN 10 MG/ML IV SOLN
INTRAVENOUS | Status: AC
  Filled 2018-08-22: qty 100

## 2018-08-22 MED ORDER — HYDROMORPHONE HCL 1 MG/ML IJ SOLN
0.2500 mg | INTRAMUSCULAR | Status: DC | PRN
  Administered 2018-08-22 (×2): 0.5 mg via INTRAVENOUS

## 2018-08-22 MED ORDER — KETOROLAC TROMETHAMINE 30 MG/ML IJ SOLN
30.0000 mg | Freq: Four times a day (QID) | INTRAMUSCULAR | Status: AC
  Administered 2018-08-22 – 2018-08-26 (×20): 30 mg via INTRAVENOUS
  Filled 2018-08-22 (×20): qty 1

## 2018-08-22 MED ORDER — ONDANSETRON 4 MG PO TBDP: 4.0000 mg | ORAL_TABLET | Freq: Four times a day (QID) | ORAL | Status: DC | PRN

## 2018-08-22 MED ORDER — ACETAMINOPHEN 160 MG/5ML PO SOLN: 325.0000 mg | Freq: Once | ORAL | Status: DC | PRN

## 2018-08-22 MED ORDER — ONDANSETRON HCL 4 MG/2ML IJ SOLN: 4.0000 mg | Freq: Four times a day (QID) | INTRAMUSCULAR | Status: DC | PRN

## 2018-08-22 MED ORDER — ACETAMINOPHEN 325 MG PO TABS: 325.0000 mg | ORAL_TABLET | Freq: Once | ORAL | Status: DC | PRN

## 2018-08-22 MED ORDER — MORPHINE SULFATE (PF) 2 MG/ML IV SOLN
2.0000 mg | INTRAVENOUS | Status: DC | PRN
  Administered 2018-08-22: 02:00:00 4 mg via INTRAVENOUS
  Filled 2018-08-22: qty 2

## 2018-08-22 MED ORDER — LACTATED RINGERS IV SOLN
INTRAVENOUS | Status: DC
  Administered 2018-08-22: via INTRAVENOUS

## 2018-08-22 MED ORDER — MEPERIDINE HCL 25 MG/ML IJ SOLN: 6.2500 mg | INTRAMUSCULAR | Status: DC | PRN

## 2018-08-22 MED ORDER — ACETAMINOPHEN 10 MG/ML IV SOLN
1000.0000 mg | Freq: Once | INTRAVENOUS | Status: DC | PRN
  Administered 2018-08-22: 01:00:00 1000 mg via INTRAVENOUS

## 2018-08-22 MED ORDER — DIPHENHYDRAMINE HCL 25 MG PO CAPS: 25.0000 mg | ORAL_CAPSULE | Freq: Four times a day (QID) | ORAL | Status: DC | PRN

## 2018-08-22 MED ORDER — OXYCODONE HCL 5 MG PO TABS
5.0000 mg | ORAL_TABLET | ORAL | Status: DC | PRN
  Administered 2018-08-22 (×3): 5 mg via ORAL
  Administered 2018-08-24: 10 mg via ORAL
  Administered 2018-08-25: 5 mg via ORAL
  Administered 2018-08-26 – 2018-08-27 (×4): 10 mg via ORAL
  Filled 2018-08-22 (×2): qty 1
  Filled 2018-08-22 (×6): qty 2
  Filled 2018-08-22 (×3): qty 1

## 2018-08-22 NOTE — Consult Note (Signed)
[place] Nurse wound consult note Reason for Consult:Midline abdominal NPWT (VAC) dressing.  To be changed 08/23/18.  WIll change ostomy pouch at that time as well due to overlap. Written materials given to patient and explained that I would be teaching her self care.  She lives with a partner and states that she will be performing care independently since he travels a lot.  Wound type:surgical wound Pressure Injury POA: NA WOC Nurse ostomy consult note Stoma type/location: RLQ ileostomy  Pouch intact blood tinged liquid in pouch.  (+) for flatus, she reports.  Stomal assessment/size: visulaized through pouch   Pink and patent   moist  Output liquid only Ostomy pouching: 1pc. convex Education provided: Given materials and discussed frequency of pouch changes, showering and activity level once recovered from surgery.  Enrolled patient in [place] program:No [place] team will follow.  Domenic Moras MSN, RN, FNP-BC CWON Wound, Ostomy, Continence Nurse Pager [phone]

## 2018-08-22 NOTE — Progress Notes (Signed)
1 Day Post-Op   Subjective/Chief Complaint: No complaints, discussed surgery and findings with her, no n/v, pain controlled   Objective: Vital signs in last 24 hours: Temp:  [97 F (36.1 C)-98.6 F (37 C)] 97.7 F (36.5 C) (07/01 0800) Pulse Rate:  [61-82] 61 (07/01 0800) Resp:  [14-27] 22 (07/01 0800) BP: (99-131)/(47-71) 103/59 (07/01 0800) SpO2:  [93 %-100 %] 95 % (07/01 0800) Arterial Line BP: (108-152)/(57-80) 112/57 (07/01 0600) Weight:  [66.7 kg] 66.7 kg (06/30 1424) Last BM Date: 08/21/18  Intake/Output from previous day: 06/30 0701 - 07/01 0700 In: 3074.3 [I.V.:2923.1; IV Piggyback:151.2] Out: 1040 [Urine:940; Blood:100] Intake/Output this shift: Total I/O In: 314.4 [P.O.:120; I.V.:194.4] Out: -   cv rrr Lungs clear abd vac in place, no bs, ileostomy pink no output approp tender   Lab Results:  Recent Labs    08/21/18 1427  WBC 8.5  HGB 13.4  HCT 40.1  PLT 332   BMET Recent Labs    08/21/18 1427  NA 135  K 3.5  CL 103  CO2 23  GLUCOSE 125*  BUN 10  CREATININE 0.82  CALCIUM 8.9   PT/INR No results for input(s): LABPROT, INR in the last 72 hours. ABG No results for input(s): PHART, HCO3 in the last 72 hours.  Invalid input(s): PCO2, PO2  Studies/Results: Ct Abdomen Pelvis W Contrast  Result Date: 08/21/2018 CLINICAL DATA:  Lower abdominal pain EXAM: CT ABDOMEN AND PELVIS WITH CONTRAST TECHNIQUE: Multidetector CT imaging of the abdomen and pelvis was performed using the standard protocol following bolus administration of intravenous contrast. CONTRAST:  12mL OMNIPAQUE IOHEXOL 300 MG/ML  SOLN COMPARISON:  None FINDINGS: Lower chest: Subsegmental atelectasis is identified within the lung bases posteriorly. Hepatobiliary: No focal liver abnormality is seen. No gallstones, gallbladder wall thickening, or biliary dilatation. Pancreas: Unremarkable. No pancreatic ductal dilatation or surrounding inflammatory changes. Spleen: Normal in size without  focal abnormality. Adrenals/Urinary Tract: Normal adrenal glands. The kidneys are unremarkable. The urinary bladder appears normal. Stomach/Bowel: There are extensive inflammatory changes within the lower abdomen and pelvis including free fluid, fat stranding and bowel wall thickening. Multiple foci of interloop, extraluminal foci of gas compatible with perforation. Wall thickening involves right lower quadrant and pelvic small bowel loops as well as sigmoid colon and rectum. Multiple small foci of extraluminal gas identified between bowel loops. The largest of these is identified between sigmoid colon and a loop of ileum within the central pelvis measuring 1.8 cm, image 38/6. Scattered colonic diverticula noted. Within the cul-de-sac there is a peripherally enhancing fluid collection which measures 5.7 by 2.9 by 5.4 cm. A normal appendix is not visualized. No findings identified to suggest bowel obstruction. Vascular/Lymphatic: Normal appearance of the abdominal aorta. No aneurysm. Small retroperitoneal lymph nodes are likely reactive. No pelvic or inguinal adenopathy identified. Reproductive: Uterus appears normal.  No adnexal mass identified. Other: Extensive scratch set free fluid extends along the right pericolic gutter. A small amount of fluid extends over the anterior right lobe of liver. Musculoskeletal: No aggressive lytic or sclerotic bone lesions. IMPRESSION: 1. Examination is remarkable for extensive inflammatory changes involving the lower abdomen and pelvis. There is diffuse fat stranding, free fluid and bowel wall thickening involving the small and large bowel loops within the lower abdomen and pelvis. Primary differential considerations include: Perforated sigmoid diverticulitis, active inflammatory bowel disease complicated by penetrating disease versus perforated appendicitis. 2. Evidence of interloop, extraluminal gas compatible with perforated viscus. 3. Posterior pelvic fluid collection  identified which is  concerning for abscess. 4. Critical Value/emergent results were called by telephone at the time of interpretation on 08/21/2018 at 7:00 pm to Dr. Sharen HeckLAUDIA Huff , who verbally acknowledged these results. Electronically Signed   By: Linda Huff  [place] M.D.   On: 08/21/2018 19:00    Anti-infectives: Anti-infectives (From admission, onward)   Start     Dose/Rate Route Frequency Ordered Stop   08/22/18 0300  piperacillin-tazobactam (ZOSYN) IVPB 3.375 g     3.375 g 12.5 mL/hr over 240 Minutes Intravenous Every 8 hours 08/22/18 0154     08/21/18 1900  piperacillin-tazobactam (ZOSYN) IVPB 3.375 g     3.375 g 100 mL/hr over 30 Minutes Intravenous  Once 08/21/18 1859 08/21/18 2021      Assessment/Plan: POD 1 sigmodectomy, primary anastomosis, diverting loop ileostomy for perf diverticulitis- Linda Huff -clears until more bowel function -oob pulm toilet -wound vac changes start tomorrow -dc aline -path pending ID- zosyn d1/5, high risk for abscess Foley- dc pod 2 tomorrow VTE- lovenox, scds   Linda Huff 08/22/2018

## 2018-08-22 NOTE — Transfer of Care (Signed)
Immediate Anesthesia Transfer of Care Note  Patient: Linda Huff  Procedure(s) Performed: EXPLORATORY LAPAROTOMY,   SIGMOID  COLECTOMY, LOOP ILEOSTOMY, INCIDENTAL APPENDECTOMY,, APPLICATION OF WOUND VAC (N/A Abdomen)  Patient Location: PACU  Anesthesia Type:General  Level of Consciousness: drowsy  Airway & Oxygen Therapy: Patient Spontanous Breathing and Patient connected to face mask oxygen  Post-op Assessment: Report given to RN and Post -op Vital signs reviewed and stable  Post vital signs: Reviewed and stable  Last Vitals:  Vitals Value Taken Time  BP    Temp    Pulse    Resp    SpO2      Last Pain:  Vitals:   08/21/18 1947  TempSrc:   PainSc: 3          Complications: No apparent anesthesia complications

## 2018-08-22 NOTE — Evaluation (Signed)
Physical Therapy Evaluation Patient Details Name: Linda SchillingJennifer H Covey MRN: [id number] DOB: 06/19/1970 Today's Date: 08/22/2018   History of Present Illness  Patient is a 48 y/o female POD 1 sigmodectomy, primary anastomosis, diverting loop ileostomy for perf diverticulitis-  Clinical Impression  Patient presents with decreased mobility due to pain, decreased activity tolerance, and generalized weakness.  She will benefit from skilled PT in the acute setting to allow return home with family support.  She will likely not need PT follow up, but will update recommendations if she doesn't progress as expected.     Follow Up Recommendations No PT follow up    Equipment Recommendations  Other (comment)(TBA, likely will not need walker, maybe shower chair)    Recommendations for Other Services       Precautions / Restrictions Precautions Precautions: Other (comment) Precaution Comments: wound vac, ileostomy      Mobility  Bed Mobility Overal bed mobility: Needs Assistance Bed Mobility: Rolling;Sidelying to Sit Rolling: Min guard Sidelying to sit: Min assist       General bed mobility comments: cues for technique, assisted with rail, educated about pillow at abdomen  Transfers Overall transfer level: Needs assistance Equipment used: Rolling walker (2 wheeled) Transfers: Sit to/from Stand Sit to Stand: Min guard         General transfer comment: cues for hand placement, assist for lines and stability  Ambulation/Gait Ambulation/Gait assistance: Min guard Gait Distance (Feet): 100 Feet Assistive device: Rolling walker (2 wheeled) Gait Pattern/deviations: Step-to pattern;Decreased stride length;Trunk flexed     General Gait Details: mildly flexed with ambulation, cues for turns and assist for safety  Stairs            Wheelchair Mobility    Modified Rankin (Stroke Patients Only)       Balance Overall balance assessment: No apparent balance deficits (not  formally assessed)                                           Pertinent Vitals/Pain Pain Assessment: 0-10 Pain Score: 4  Pain Location: abdomen Pain Descriptors / Indicators: Sore;Tender Pain Intervention(s): Monitored during session;Repositioned;RN gave pain meds during session    Home Living Family/patient expects to be discharged to:: Private residence Living Arrangements: Spouse/significant other;Children Available Help at Discharge: Family;Available 24 hours/day Type of Home: House Home Access: Stairs to enter Entrance Stairs-Rails: None Entrance Stairs-Number of Steps: 3 Home Layout: Two level;Able to live on main level with bedroom/bathroom Home Equipment: None      Prior Function Level of Independence: Independent               Hand Dominance   Dominant Hand: Right    Extremity/Trunk Assessment   Upper Extremity Assessment Upper Extremity Assessment: Overall WFL for tasks assessed    Lower Extremity Assessment Lower Extremity Assessment: Generalized weakness(little pain in abdomen lifting/bending legs)       Communication   Communication: No difficulties  Cognition Arousal/Alertness: Awake/alert Behavior During Therapy: WFL for tasks assessed/performed Overall Cognitive Status: Within Functional Limits for tasks assessed                                        General Comments      Exercises     Assessment/Plan    PT Assessment Patient  needs continued PT services  PT Problem List Decreased strength;Decreased activity tolerance;Decreased mobility;Decreased knowledge of use of DME;Pain       PT Treatment Interventions DME instruction;Stair training;Therapeutic activities;Patient/family education;Therapeutic exercise;Functional mobility training;Gait training    PT Goals (Current goals can be found in the Care Plan section)  Acute Rehab PT Goals Patient Stated Goal: to go home PT Goal Formulation: With  patient Time For Goal Achievement: 08/29/18 Potential to Achieve Goals: Good    Frequency Min 3X/week   Barriers to discharge        Co-evaluation               AM-PAC PT "6 Clicks" Mobility  Outcome Measure Help needed turning from your back to your side while in a flat bed without using bedrails?: A Little Help needed moving from lying on your back to sitting on the side of a flat bed without using bedrails?: A Little Help needed moving to and from a bed to a chair (including a wheelchair)?: A Little Help needed standing up from a chair using your arms (e.g., wheelchair or bedside chair)?: A Little Help needed to walk in hospital room?: A Little Help needed climbing 3-5 steps with a railing? : A Little 6 Click Score: 18    End of Session   Activity Tolerance: Patient tolerated treatment well Patient left: in chair;with call bell/phone within reach Nurse Communication: Mobility status PT Visit Diagnosis: Difficulty in walking, not elsewhere classified (R26.2);Muscle weakness (generalized) (M62.81)    Time: 7124-5809 PT Time Calculation (min) (ACUTE ONLY): 25 min   Charges:   PT Evaluation $PT Eval Moderate Complexity: 1 Mod PT Treatments $Gait Training: 8-22 mins        Magda Kiel, [place] [facility] [phone] 08/22/2018   Reginia Naas 08/22/2018, 12:58 PM

## 2018-08-22 NOTE — Anesthesia Postprocedure Evaluation (Signed)
Anesthesia Post Note  Patient: Linda Huff  Procedure(s) Performed: EXPLORATORY LAPAROTOMY,   SIGMOID  COLECTOMY, LOOP ILEOSTOMY, INCIDENTAL APPENDECTOMY,, APPLICATION OF WOUND VAC (N/A Abdomen)     Patient location during evaluation: PACU Anesthesia Type: General Level of consciousness: awake and alert Pain management: pain level controlled Vital Signs Assessment: post-procedure vital signs reviewed and stable Respiratory status: spontaneous breathing, nonlabored ventilation, respiratory function stable and patient connected to nasal cannula oxygen Cardiovascular status: blood pressure returned to baseline and stable Postop Assessment: no apparent nausea or vomiting Anesthetic complications: no    Last Vitals:  Vitals:   08/22/18 0045 08/22/18 0053  BP:  127/64  Pulse: 72 74  Resp: 19 (!) 21  Temp:    SpO2: 100% 96%    Last Pain:  Vitals:   08/22/18 0038  TempSrc:   PainSc: Asleep                 Effie Berkshire

## 2018-08-22 NOTE — Progress Notes (Signed)
Pharmacy Antibiotic Note  Linda Huff is a 48 y.o. female admitted on 08/21/2018 with intra abdominal infection.  Pharmacy has been consulted for zosyn dosing.  Plan: Zosyn 3.375g IV q8h (4 hour infusion).  F/u cultures and clinical course.  Height: 5\' 4"  (162.6 cm) Weight: 147 lb (66.7 kg) IBW/kg (Calculated) : 54.7  Temp (24hrs), Avg:97.7 F (36.5 C), Min:97 F (36.1 C), Max:98.5 F (36.9 C)  Recent Labs  Lab 08/21/18 1427  WBC 8.5  CREATININE 0.82    Estimated Creatinine Clearance: 81.4 mL/min (by C-G formula based on SCr of 0.82 mg/dL).    No Known Allergies  Thank you for allowing pharmacy to be a part of this patient's care.  Excell Seltzer Poteet 08/22/2018 1:55 AM

## 2018-08-22 NOTE — Op Note (Addendum)
Preop diagnosis: Perforated viscus with peritonitis; probable perforated sigmoid diverticulitis Postop diagnosis: Perforated sigmoid diverticulitis with pelvic abscess and peritonitis Procedure performed: Exploratory laparotomy, sigmoid colectomy with primary anastomosis, diverting loop ileostomy, incidental appendectomy, placement of wound VAC (30 cm) Surgeon:Danyel Griess K Shirell Struthers  Assistant: Dr. Phylliss Blakeshelsea Connor  Anesthesia: General endotracheal Indications: This is a 48 year old female who presents with 5 days of pressure and tenderness in the suprapubic region.  This became progressively worse and spread across her entire abdomen.  She had poor appetite.  She presented to the emergency department for evaluation.  She did have a normal white blood cell count.  However CT scan had multiple worrisome findings: IMPRESSION: 1. Examination is remarkable for extensive inflammatory changes involving the lower abdomen and pelvis. There is diffuse fat stranding, free fluid and bowel wall thickening involving the small and large bowel loops within the lower abdomen and pelvis. Primary differential considerations include: Perforated sigmoid diverticulitis, active inflammatory bowel disease complicated by penetrating disease versus perforated appendicitis. 2. Evidence of interloop, extraluminal gas compatible with perforated viscus. 3. Posterior pelvic fluid collection identified which is concerning for abscess.  After examining the patient and reviewing the CT scan I made the decision to proceed directly to the operating room for exploratory laparotomy.    Description of procedure: The patient is brought to the operating room and placed in the supine position on the operating room table.  After an adequate level of general anesthesia was obtained a Foley catheter was placed under sterile technique.  The patient's legs were placed in lithotomy position in yellowfin stirrups for the possibility of a colorectal  anastomosis.  The abdomen was prepped with ChloraPrep and the perineum was prepped with Betadine.  We draped in sterile fashion.  A timeout was taken to ensure the proper patient and proper procedure.  We made a lower midline incision.  Dissection was carried down to the linea alba.  We entered the peritoneal cavity sharply.  We encountered some cloudy yellowish fluid.  We opened the incision widely and encountered some inflamed small bowel and a significant amount of free fluid.  I quickly explored the pelvis and drained a large pelvic abscess.  The sigmoid colon has an area that is very thickened and inflamed.  We are able to visualize this area and there is an obvious perforation in this area.  There is no stool noted but there is a lot of purulent fluid.  The peritoneum is quite visibly inflamed.  We explored the remainder of the abdomen there were no obvious abscesses.  We placed the Balfour retractor and packed the small bowel away in the upper abdomen.  The patient has some fairly redundant sigmoid colon.  The involved area is a fairly short segment measuring only about 6 to 7 cm long.  We mobilized the sigmoid colon away from the lateral abdominal wall by dividing the white line of Toldt.  We mobilized the colon medially.  The redundant sigmoid colon came up easily into the wound.  The rectum distal to the area of inflammation is quite soft and normal.  There is some slight edema in the mesentery.  Proximally the colon appeared normal but did contain some stool.  I divided proximal and distal to the segment of inflammation and perforation with GIA-75 stapler.  The sigmoid mesocolon was divided with the LigaSure device.  This was oriented with a suture proximal.  This was sent for pathologic examination.  We examined both staple lines and both appeared to  be well perfused and free of any leak or bleed.  We then identified the cecum in the right lower quadrant.  The appendix appears normal.  We identified the  terminal ileum and ran the small bowel in retrograde fashion.  About 20 cm proximal to the ileocecal valve, the small bowel is secondarily inflamed from being in contact with the pelvic abscess.  There is no sign of perforation.  We broke up some intraloop abscesses.  The small bowel proximal to this area is completely normal back to the ligament of Treitz.  We palpated the stomach which appeared normal.  The remainder the colon was examined and was also normal.  We then irrigated the entire abdomen thoroughly with about 8 L of warm saline.  We continued irrigating until irrigant was clear and was no longer cloudy.  We then replaced the small bowel back in the upper abdomen.  We created a side-to-side stapled anastomosis of the descending colon to the upper rectum.  The GIA 75 stapler was used as well as a TA 60 stapler.  A crotch suture of 3-0 silk was placed.  The staple line was oversewn with 3-0 silk.  The anastomosis was palpated and was widely patent and not under any tension.  We then went back to the terminal ileum.  Just proximal to the area of inflammation, we selected a point for our loop ileostomy.  We excised a small flap of skin on the right side of the abdomen just below the level of the umbilicus.  We excised the subcutaneous fat down to the fascia.  The fascia was incised in a cruciate incision.  We entered the peritoneal cavity.  We then exteriorized the loop ileostomy.  The proximal portion of the loop ileostomy is superior.  We created a small opening in the mesentery under the loop.  I passed a small flap of skin through this opening to the other side of our ostomy site.  We sewed the skin flap to the surrounding skin with 3-0 Vicryl suture to provide a temporary ileostomy bridge.  We then reirrigated the lower abdomen.  We inspected for hemostasis.  Our sponge count was correct.  I did a incidental appendectomy as the appendix appears normal but would be very difficult to reach if she developed  appendicitis in the future.  The mesoappendix was divided with the LigaSure device.  Another firing of the GIA 75 stapler was used to complete the appendectomy.  This was sent for pathology.  We again irrigated.  The appendectomy staple line appears to be intact with no sign of bleeding or leak.  The omentum was pulled down over the viscera.  The fascia was reapproximated with double-stranded #1 PDS suture.  The subcutaneous tissues were irrigated.  I cut a medium VAC sponge to fit the incision.  This was secured with an occlusive drape and was placed to 125 mmHg suction with a good seal.  The incision measured 15 x 2 cm. I then matured the ileostomy by opening with cautery.  We matured with multiple interrupted 3-0 Vicryl sutures.  Both proximal distal limbs are widely patent upon digital examination.  An ostomy appliance was cut to fit.  The patient was then extubated and brought to the recovery room in stable condition.  All sponge, instrument, and needle counts are correct.  Imogene Burn. Georgette Dover, MD, [facility] Surgery  General/ Trauma Surgery Beeper [phone]  08/22/2018 12:22 AM `

## 2018-08-23 LAB — CBC
HCT: 39.9 % (ref 36.0–46.0)
Hemoglobin: 13.6 g/dL (ref 12.0–15.0)
MCH: 28.7 pg (ref 26.0–34.0)
MCHC: 34.1 g/dL (ref 30.0–36.0)
MCV: 84.2 fL (ref 80.0–100.0)
Platelets: 399 10*3/uL (ref 150–400)
RBC: 4.74 MIL/uL (ref 3.87–5.11)
RDW: 13.7 % (ref 11.5–15.5)
WBC: 16.5 10*3/uL — ABNORMAL HIGH (ref 4.0–10.5)
nRBC: 0 % (ref 0.0–0.2)

## 2018-08-23 LAB — BASIC METABOLIC PANEL
Anion gap: 11 (ref 5–15)
BUN: 14 mg/dL (ref 6–20)
CO2: 23 mmol/L (ref 22–32)
Calcium: 8.7 mg/dL — ABNORMAL LOW (ref 8.9–10.3)
Chloride: 102 mmol/L (ref 98–111)
Creatinine, Ser: 0.89 mg/dL (ref 0.44–1.00)
GFR calc Af Amer: >60 mL/min (ref >60)
GFR calc non Af Amer: >60 mL/min (ref >60)
Glucose, Bld: 111 mg/dL — ABNORMAL HIGH (ref 70–99)
Potassium: 4 mmol/L (ref 3.5–5.1)
Sodium: 136 mmol/L (ref 135–145)

## 2018-08-23 MED ORDER — SIMETHICONE 80 MG PO CHEW: 80.0000 mg | CHEWABLE_TABLET | Freq: Four times a day (QID) | ORAL | Status: DC | PRN

## 2018-08-23 MED ORDER — ACETAMINOPHEN 650 MG RE SUPP: 650.0000 mg | Freq: Four times a day (QID) | RECTAL | Status: DC

## 2018-08-23 MED ORDER — ACETAMINOPHEN 325 MG PO TABS
650.0000 mg | ORAL_TABLET | Freq: Four times a day (QID) | ORAL | Status: DC
  Administered 2018-08-23 – 2018-08-24 (×5): 650 mg via ORAL
  Filled 2018-08-23 (×5): qty 2

## 2018-08-23 MED ORDER — ENSURE ENLIVE PO LIQD
237.0000 mL | Freq: Two times a day (BID) | ORAL | Status: DC
  Administered 2018-08-23 – 2018-08-27 (×8): 237 mL via ORAL

## 2018-08-23 MED ORDER — METHOCARBAMOL 500 MG PO TABS
500.0000 mg | ORAL_TABLET | Freq: Three times a day (TID) | ORAL | Status: DC | PRN
  Administered 2018-08-23 – 2018-08-27 (×3): 500 mg via ORAL
  Filled 2018-08-23 (×3): qty 1

## 2018-08-23 MED ORDER — PSYLLIUM 95 % PO PACK
1.0000 | PACK | Freq: Every day | ORAL | Status: DC
  Administered 2018-08-23 – 2018-08-27 (×4): 1 via ORAL
  Filled 2018-08-23 (×5): qty 1

## 2018-08-23 NOTE — Discharge Instructions (Addendum)
[facility]      [facility], [place] [phone]  OPEN ABDOMINAL SURGERY: POST OP INSTRUCTIONS  Always review your discharge instruction sheet given to you by the facility where your surgery was performed.  IF YOU HAVE DISABILITY OR FAMILY LEAVE FORMS, YOU MUST BRING THEM TO THE OFFICE FOR PROCESSING.  PLEASE DO NOT GIVE THEM TO YOUR DOCTOR.  1. A prescription for pain medication may be given to you upon discharge.  Take your pain medication as prescribed, if needed.  If narcotic pain medicine is not needed, then you may take acetaminophen (Tylenol) or ibuprofen (Advil) as needed. 2. Take your usually prescribed medications unless otherwise directed. 3. If you need a refill on your pain medication, please contact your pharmacy. They will contact our office to request authorization.  Prescriptions will not be filled after 5pm or on week-ends. 4. You should follow a light diet the first few days after arrival home, such as soup and crackers, pudding, etc.unless your doctor has advised otherwise. A high-fiber, low fat diet can be resumed as tolerated.   Be sure to include lots of fluids daily. Most patients will experience some swelling and bruising on the chest and neck area.  Ice packs will help.  Swelling and bruising can take several days to resolve 5. Most patients will experience some swelling and bruising in the area of the incision. Ice pack will help. Swelling and bruising can take several days to resolve..  6. It is common to experience some constipation if taking pain medication after surgery.  Increasing fluid intake and taking a stool softener will usually help or prevent this problem from occurring.  A mild laxative (Milk of Magnesia or Miralax) should be taken according to package directions if there are no bowel movements after 48 hours. 7. Ok to shower with your wound open 8. ACTIVITIES:  You may resume regular (light) daily activities beginning the next day--such as daily  self-care, walking, climbing stairs--gradually increasing activities as tolerated.  You may have sexual intercourse when it is comfortable.  Refrain from any heavy lifting or straining until approved by your doctor. a. You may drive when you no longer are taking prescription pain medication, you can comfortably wear a seatbelt, and you can safely maneuver your car and apply brakes 9. You should see your doctor in the office for a follow-up appointment approximately two weeks after your surgery.  Make sure that you call for this appointment within a day or two after you arrive home to insure a convenient appointment time. OTHER INSTRUCTIONS:  _____________________________________________________________ _____________________________________________________________  WHEN TO CALL YOUR DOCTOR: 1. Fever over 101.0 2. Inability to urinate 3. Nausea and/or vomiting 4. Extreme swelling or bruising 5. Continued bleeding from incision. 6. Increased pain, redness, or drainage from the incision. 7. Difficulty swallowing or breathing 8. Muscle cramping or spasms. 9. Numbness or tingling in hands or feet or around lips.  The clinic staff is available to answer your questions during regular business hours.  Please dont hesitate to call and ask to speak to one of the nurses if you have concerns.  For further questions, please visit [email or web address]    Ileostomy, Care After This sheet gives you information about how to care for yourself after your procedure. Your health care provider may also give you more specific instructions. If you have problems or questions, contact your health care provider. What can I expect after the procedure? After the procedure, it is common to have:  A small amount  of blood or clear fluid leaking from your stoma.  Pain and discomfort in your abdomen, especially around your stoma.  Irregular bowel movements for several days.  Loose stool. Follow these  instructions at home: Medicines  Take over-the-counter and prescription medicines only as told by your health care provider.  If you were prescribed an antibiotic medicine, take it as told by your health care provider. Do not stop taking the antibiotic even if you start to feel better. Stoma and incision care   Keep the skin that surrounds your stoma clean and dry.  Follow instructions from your health care provider about how to take care of your incision. Make sure you: ? Wash your hands with soap and water before and after you change your bandage (dressing). If soap and water are not available, use hand sanitizer. ? Change your dressing as told by your health care provider. ? Leave stitches (sutures), skin glue, or adhesive strips in place. These skin closures may need to stay in place for 2 weeks or longer. If adhesive strip edges start to loosen and curl up, you may trim the loose edges. Do not remove adhesive strips completely unless your health care provider tells you to do that.  Check your stoma area every day for signs of infection. Check for: ? More redness, swelling, or pain. ? More fluid or blood. ? Warmth. ? Pus or a bad smell.  Follow your health care provider's instructions about changing and cleaning your ostomy pouch.  Keep supplies with you at all times to care for your stoma and ostomy pouch. Also keep extra clothing with you. Eating and drinking   Follow instructions from your health care provider about eating or drinking restrictions.  Pay attention to which foods and drinks cause problems with digestion, such as gas, constipation, or diarrhea.  Avoid spicy foods and caffeine while your stoma heals.  Eat meals and snacks at regular intervals.  Drink enough fluid to keep your urine pale yellow. Activity   Return to your normal activities as told by your health care provider. Ask your health care provider what activities are safe for you.  Rest as much as  possible while your stoma heals.  Avoid intense physical activity for as long as you are told by your health care provider.  Do not lift anything that is heavier than 10 lb (4.5 kg), or the limit that you are told, for 6 weeks or until your health care provider says that it is safe. General instructions  Do not drive or use heavy machinery while taking prescription pain medicine.  Wear compression stockings as told by your health care provider. These stockings help to prevent blood clots and reduce swelling in your legs.  Do not take baths, swim, or use a hot tub until your health care provider approves. Ask your health care provider if you may take showers.  Do not use any products that contain nicotine or tobacco, such as cigarettes, e-cigarettes, and chewing tobacco. These can delay incision healing after surgery. If you need help quitting, ask your health care provider.  (Women) Talk with your health care provider if you plan to become pregnant or if you take birth control pills.  Keep all follow-up visits as told by your health care provider. This is important. Contact a health care provider if:  You have more redness, swelling, or pain at or around your stoma.  You have more fluid or blood coming from your stoma.  Your stoma feels  warm to the touch.  You have pus or a bad smell coming from your stoma.  You have a fever.  You have loose stools that do not become thicker after several weeks.  You have bowel movements more often or less often than your health care provider tells you to expect.  You feel nauseous.  You vomit.  You have abdominal pain, bloating, pressure, or cramping.  You have problems with sexual activity.  You have an unusual lack of energy (fatigue).  You are unusually thirsty or you always have a dry mouth. Get help right away if:  You feel dizzy or light-headed.  You have pain or cramps in your abdomen that get worse or do not go away with  medicine.  Your stoma suddenly changes size or color.  You have shortness of breath.  You have bleeding from your stoma that does not stop.  You vomit more than one time.  You faint.  You have internal tissue coming out of your stoma (prolapse).  You have an irregular heartbeat.  You have chest pain. Summary  Take over-the-counter and prescription medicines only as told by your health care provider.  Follow your health care provider's instructions about how to take care of your incision and stoma.  Follow instructions from your health care provider about eating or drinking restrictions.  Do not take baths, swim, or use a hot tub until your health care provider approves. Ask your health care provider if you may take showers.  Contact a health care provider if you have more redness, swelling, or pain at or around your stoma. This information is not intended to replace advice given to you by your health care provider. Make sure you discuss any questions you have with your health care provider. Document Released: 01/19/2015 Document Revised: 10/16/2017 Document Reviewed: 10/16/2017 Elsevier Patient Education  2020 [employer]   MIDLINE WOUND CARE: - midline dressing to be changed twice daily - supplies: sterile saline, kerlix, scissors, ABD pads, tape  - remove dressing and all packing carefully, moistening with sterile saline as needed to avoid packing/internal dressing sticking to the wound. - clean edges of skin around the wound with water/gauze, making sure there is no tape debris or leakage left on skin that could cause skin irritation or breakdown. - dampen and clean kerlix with sterile saline and pack wound from wound base to skin level, making sure to take note of any possible areas of wound tracking, tunneling and packing appropriately. Wound can be packed loosely. Trim kerlix to size if a whole kerlix is not required. - cover wound with a dry ABD pad and secure with  tape.  - write the date/time on the dry dressing/tape to better track when the last dressing change occurred. - change dressing as needed if leakage occurs, wound gets contaminated, or patient requests to shower. - patient may shower daily with wound open and following the shower the wound should be dried and a clean dressing placed.

## 2018-08-23 NOTE — Consult Note (Signed)
[facility] Nurse wound follow up Originally, wound Vac was to be changed Tues/Thur/Sat.  No stool output yet, [place], [place], in agreement that we can change VAC and pouch tomorrow and begin M/W/F pouch changes at that time.  Midline abdominal VAC and ostomy pouch will be changed 08/24/18/  Domenic Moras MSN, RN, FNP-BC [facility] Wound, Ostomy, Continence Nurse Pager [phone]

## 2018-08-23 NOTE — Progress Notes (Addendum)
[facility] Progress Note  2 Days Post-Op  Subjective: CC-  Complaining of some crampy abdominal pain. Pain medication helps, but states that without the medications her pain is similar to prior to surgery. Some nausea last night, no emesis. Tolerating clear liquids.  Flatus and 2.5L light green/clear liquid (no stool) out from ostomy last 24 hours  WBC up 16.5, afebrile.  Objective: Vital signs in last 24 hours: Temp:  [97.8 F (36.6 C)-98.8 F (37.1 C)] 97.8 F (36.6 C) (07/02 0800) Pulse Rate:  [60-69] 69 (07/02 0800) Resp:  [20-27] 20 (07/02 0800) BP: (96-106)/(56-72) 106/72 (07/02 0800) SpO2:  [96 %] 96 % (07/02 0800) Last BM Date: 08/21/18  Intake/Output from previous day: 07/01 0701 - 07/02 0700 In: 1542.4 [P.O.:480; I.V.:923.6; IV Piggyback:138.8] Out: 3705 [Urine:1175; Drains:30; Stool:2500] Intake/Output this shift: Total I/O In: -  Out: 100 [Stool:100]  PE: Gen:  Alert, NAD, pleasant HEENT: EOM's intact, pupils equal and round Card:  RRR Pulm:  CTAB, no W/R/R, effort normal Abd: Soft, mild distension, +BS, mild lower abdominal TTP without rebound or guarding, vac to midline incision, ostomy with clear/light green liquid in ostomy pouch (no stool) Psych: A&Ox3  Skin: no rashes noted, warm and dry  Lab Results:  Recent Labs    08/21/18 1427 08/23/18 0539  WBC 8.5 16.5*  HGB 13.4 13.6  HCT 40.1 39.9  PLT 332 399   BMET Recent Labs    08/21/18 1427 08/23/18 0539  NA 135 136  K 3.5 4.0  CL 103 102  CO2 23 23  GLUCOSE 125* 111*  BUN 10 14  CREATININE 0.82 0.89  CALCIUM 8.9 8.7*   PT/INR No results for input(s): LABPROT, INR in the last 72 hours. CMP     Component Value Date/Time   NA 136 08/23/2018 0539   K 4.0 08/23/2018 0539   CL 102 08/23/2018 0539   CO2 23 08/23/2018 0539   GLUCOSE 111 (H) 08/23/2018 0539   BUN 14 08/23/2018 0539   CREATININE 0.89 08/23/2018 0539   CALCIUM 8.7 (L) 08/23/2018 0539   PROT 6.7  08/21/2018 1427   ALBUMIN 3.5 08/21/2018 1427   AST 21 08/21/2018 1427   ALT 18 08/21/2018 1427   ALKPHOS 63 08/21/2018 1427   BILITOT 1.2 08/21/2018 1427   GFRNONAA >60 08/23/2018 0539   GFRAA >60 08/23/2018 0539   Lipase     Component Value Date/Time   LIPASE 22 08/21/2018 1427       Studies/Results: Ct Abdomen Pelvis W Contrast  Result Date: 08/21/2018 CLINICAL DATA:  Lower abdominal pain EXAM: CT ABDOMEN AND PELVIS WITH CONTRAST TECHNIQUE: Multidetector CT imaging of the abdomen and pelvis was performed using the standard protocol following bolus administration of intravenous contrast. CONTRAST:  177mL OMNIPAQUE IOHEXOL 300 MG/ML  SOLN COMPARISON:  None FINDINGS: Lower chest: Subsegmental atelectasis is identified within the lung bases posteriorly. Hepatobiliary: No focal liver abnormality is seen. No gallstones, gallbladder wall thickening, or biliary dilatation. Pancreas: Unremarkable. No pancreatic ductal dilatation or surrounding inflammatory changes. Spleen: Normal in size without focal abnormality. Adrenals/Urinary Tract: Normal adrenal glands. The kidneys are unremarkable. The urinary bladder appears normal. Stomach/Bowel: There are extensive inflammatory changes within the lower abdomen and pelvis including free fluid, fat stranding and bowel wall thickening. Multiple foci of interloop, extraluminal foci of gas compatible with perforation. Wall thickening involves right lower quadrant and pelvic small bowel loops as well as sigmoid colon and rectum. Multiple small foci of extraluminal gas identified between bowel  loops. The largest of these is identified between sigmoid colon and a loop of ileum within the central pelvis measuring 1.8 cm, image 38/6. Scattered colonic diverticula noted. Within the cul-de-sac there is a peripherally enhancing fluid collection which measures 5.7 by 2.9 by 5.4 cm. A normal appendix is not visualized. No findings identified to suggest bowel obstruction.  Vascular/Lymphatic: Normal appearance of the abdominal aorta. No aneurysm. Small retroperitoneal lymph nodes are likely reactive. No pelvic or inguinal adenopathy identified. Reproductive: Uterus appears normal.  No adnexal mass identified. Other: Extensive scratch set free fluid extends along the right pericolic gutter. A small amount of fluid extends over the anterior right lobe of liver. Musculoskeletal: No aggressive lytic or sclerotic bone lesions. IMPRESSION: 1. Examination is remarkable for extensive inflammatory changes involving the lower abdomen and pelvis. There is diffuse fat stranding, free fluid and bowel wall thickening involving the small and large bowel loops within the lower abdomen and pelvis. Primary differential considerations include: Perforated sigmoid diverticulitis, active inflammatory bowel disease complicated by penetrating disease versus perforated appendicitis. 2. Evidence of interloop, extraluminal gas compatible with perforated viscus. 3. Posterior pelvic fluid collection identified which is concerning for abscess. 4. Critical Value/emergent results were called by telephone at the time of interpretation on 08/21/2018 at 7:00 pm to Dr. Sharen HeckLAUDIA GIBBONS , who verbally acknowledged these results. Electronically Signed   By: Signa Kellaylor  Stroud M.D.   On: 08/21/2018 19:00    Anti-infectives: Anti-infectives (From admission, onward)   Start     Dose/Rate Route Frequency Ordered Stop   08/22/18 0300  piperacillin-tazobactam (ZOSYN) IVPB 3.375 g     3.375 g 12.5 mL/hr over 240 Minutes Intravenous Every 8 hours 08/22/18 0154     08/21/18 1900  piperacillin-tazobactam (ZOSYN) IVPB 3.375 g     3.375 g 100 mL/hr over 30 Minutes Intravenous  Once 08/21/18 1859 08/21/18 2021       Assessment/Plan POD 2 Sigmodectomy, primary anastomosis, diverting loop ileostomy for perf diverticulitis- [name] - surgical path pending - first vac change tomorrow, M/W/F - Mobilize - Ok for full liquids.  Add daily metamucil and monitor ileostomy output closely for high output. Schedule tylenol and add robaxin PRN for better pain control. Simethicone PRN gas pains. Labs in AM.  ID- zosyn d2/5, high risk for abscess Foley- dc 7/2 VTE- lovenox, scds FEN - IVF @100cc /hr, FLD   LOS: 2 days    Franne FortsBrooke A  , [facility] InstituteA-C [facility] 08/23/2018, 9:27 AM Pager: [phone] Mon-Thurs 7:00 am-4:30 pm Fri 7:00 am -11:30 AM Sat-Sun 7:00 am-11:30 am

## 2018-08-23 NOTE — Progress Notes (Signed)
Physical Therapy Treatment Patient Details Name: Linda Huff MRN: [id number] DOB: 1970-11-28 Today's Date: 08/23/2018    History of Present Illness Patient is a 48 y/o female POD 1 sigmodectomy, primary anastomosis, diverting loop ileostomy for perf diverticulitis-    PT Comments    Pt performed gt without device this pm.  Pt please with her progress.  Will plan for stair training next session.   Follow Up Recommendations  No PT follow up     Equipment Recommendations  Other (comment)    Recommendations for Other Services       Precautions / Restrictions Precautions Precautions: Other (comment) Precaution Comments: ileostomy Restrictions Weight Bearing Restrictions: No    Mobility  Bed Mobility Overal bed mobility: Needs Assistance Bed Mobility: Sit to Supine       Sit to supine: Min guard   General bed mobility comments: min guard for LEs and lowering trunk.  Transfers Overall transfer level: Needs assistance Equipment used: None Transfers: Sit to/from Stand Sit to Stand: Min guard         General transfer comment: cues for hand placement, assist for lines and stability  Ambulation/Gait Ambulation/Gait assistance: Min guard;Supervision Gait Distance (Feet): 350 Feet Assistive device: None Gait Pattern/deviations: Step-to pattern;Decreased stride length;Trunk flexed     General Gait Details: Cues for reciprocal armswing and upper trunk control.   Stairs             Wheelchair Mobility    Modified Rankin (Stroke Patients Only)       Balance Overall balance assessment: No apparent balance deficits (not formally assessed)                                          Cognition Arousal/Alertness: Awake/alert Behavior During Therapy: WFL for tasks assessed/performed Overall Cognitive Status: Within Functional Limits for tasks assessed                                        Exercises      General  Comments        Pertinent Vitals/Pain Pain Assessment: 0-10 Pain Location: abdomen Pain Descriptors / Indicators: Sore;Tender Pain Intervention(s): Monitored during session;Repositioned    Home Living                      Prior Function            PT Goals (current goals can now be found in the care plan section) Acute Rehab PT Goals Patient Stated Goal: to go home PT Goal Formulation: With patient Potential to Achieve Goals: Good Progress towards PT goals: Progressing toward goals    Frequency    Min 3X/week      PT Plan Current plan remains appropriate    Co-evaluation              AM-PAC PT "6 Clicks" Mobility   Outcome Measure  Help needed turning from your back to your side while in a flat bed without using bedrails?: A Little Help needed moving from lying on your back to sitting on the side of a flat bed without using bedrails?: A Little Help needed moving to and from a bed to a chair (including a wheelchair)?: A Little Help needed standing up from a chair using your arms (  e.g., wheelchair or bedside chair)?: A Little Help needed to walk in hospital room?: A Little Help needed climbing 3-5 steps with a railing? : A Little 6 Click Score: 18    End of Session Equipment Utilized During Treatment: Gait belt Activity Tolerance: Patient tolerated treatment well Patient left: in chair;with call bell/phone within reach Nurse Communication: Mobility status PT Visit Diagnosis: Difficulty in walking, not elsewhere classified (R26.2);Muscle weakness (generalized) (M62.81)     Time: 1343-1401 PT Time Calculation (min) (ACUTE ONLY): 18 min  Charges:  $Gait Training: 8-22 mins                     Joycelyn RuaAimee Melah Ebling, PTA Acute Rehabilitation Services Pager [phone] Office [phone]     [name] Artis DelayJ Atwell Mcdanel 08/23/2018, 3:17 PM

## 2018-08-24 LAB — BASIC METABOLIC PANEL
Anion gap: 10 (ref 5–15)
BUN: 14 mg/dL (ref 6–20)
CO2: 23 mmol/L (ref 22–32)
Calcium: 8.6 mg/dL — ABNORMAL LOW (ref 8.9–10.3)
Chloride: 101 mmol/L (ref 98–111)
Creatinine, Ser: 0.73 mg/dL (ref 0.44–1.00)
GFR calc Af Amer: >60 mL/min (ref >60)
GFR calc non Af Amer: >60 mL/min (ref >60)
Glucose, Bld: 108 mg/dL — ABNORMAL HIGH (ref 70–99)
Potassium: 3.6 mmol/L (ref 3.5–5.1)
Sodium: 134 mmol/L — ABNORMAL LOW (ref 135–145)

## 2018-08-24 LAB — CBC
HCT: 37 % (ref 36.0–46.0)
Hemoglobin: 12.7 g/dL (ref 12.0–15.0)
MCH: 28.9 pg (ref 26.0–34.0)
MCHC: 34.3 g/dL (ref 30.0–36.0)
MCV: 84.3 fL (ref 80.0–100.0)
Platelets: 377 10*3/uL (ref 150–400)
RBC: 4.39 MIL/uL (ref 3.87–5.11)
RDW: 13.6 % (ref 11.5–15.5)
WBC: 10.2 10*3/uL (ref 4.0–10.5)
nRBC: 0 % (ref 0.0–0.2)

## 2018-08-24 MED ORDER — ACETAMINOPHEN 500 MG PO TABS
1000.0000 mg | ORAL_TABLET | Freq: Four times a day (QID) | ORAL | Status: DC
  Administered 2018-08-24 – 2018-08-27 (×8): 1000 mg via ORAL
  Filled 2018-08-24 (×10): qty 2

## 2018-08-24 NOTE — Progress Notes (Signed)
Physical Therapy Treatment Patient Details Name: Linda SchillingJennifer H Huff MRN: [id number] DOB: 03/08/1970 Today's Date: 08/24/2018    History of Present Illness Patient is a 48 y/o female s/p sigmodectomy, primary anastomosis and diverting loop ileostomy for perf diverticulitis.    PT Comments    Patient progressing well towards PT goals. Excited to be able to get up and move. Improved ambulation distance with supervision for safety/line management. Tolerated stair training with supervision for safety as well. Encouraged ambulation with nursing a few times per day to improve overall mobility and strength/endurance. pt agreeable. Plans to d/c home over the weekend. Will follow.    Follow Up Recommendations  No PT follow up     Equipment Recommendations  None recommended by PT    Recommendations for Other Services       Precautions / Restrictions Precautions Precautions: Other (comment) Precaution Comments: ileostomy; wound vac Restrictions Weight Bearing Restrictions: No    Mobility  Bed Mobility               General bed mobility comments: Up in chair upon PT arrival.  Transfers Overall transfer level: Needs assistance Equipment used: None Transfers: Sit to/from Stand Sit to Stand: Supervision         General transfer comment: Supervision for safety. Stood from chair x2, from toilet x1.  Ambulation/Gait Ambulation/Gait assistance: Supervision;Modified independent (Device/Increase time) Gait Distance (Feet): 500 Feet Assistive device: None Gait Pattern/deviations: Decreased stride length;Step-through pattern Gait velocity: good speed by the end Gait velocity interpretation: 1.31 - 2.62 ft/sec, indicative of limited community ambulator General Gait Details: Slow, guarded gait which improved with increased distance.   Stairs Stairs: Yes Stairs assistance: Supervision Stair Management: One rail Left;Step to pattern;Alternating pattern;Forwards Number of Stairs:  3 General stair comments: Cues for safety and help to manage lines.   Wheelchair Mobility    Modified Rankin (Stroke Patients Only)       Balance Overall balance assessment: No apparent balance deficits (not formally assessed)                                          Cognition Arousal/Alertness: Awake/alert Behavior During Therapy: WFL for tasks assessed/performed Overall Cognitive Status: Within Functional Limits for tasks assessed                                        Exercises      General Comments General comments (skin integrity, edema, etc.): VSS      Pertinent Vitals/Pain Pain Assessment: Faces Faces Pain Scale: Hurts a little bit Pain Location: abdomen Pain Descriptors / Indicators: Sore;Tender Pain Intervention(s): Repositioned;Monitored during session    Home Living                      Prior Function            PT Goals (current goals can now be found in the care plan section) Progress towards PT goals: Progressing toward goals    Frequency    Min 3X/week      PT Plan Current plan remains appropriate    Co-evaluation              AM-PAC PT "6 Clicks" Mobility   Outcome Measure  Help needed turning from your back to your side  while in a flat bed without using bedrails?: A Little Help needed moving from lying on your back to sitting on the side of a flat bed without using bedrails?: A Little Help needed moving to and from a bed to a chair (including a wheelchair)?: None Help needed standing up from a chair using your arms (e.g., wheelchair or bedside chair)?: None Help needed to walk in hospital room?: None Help needed climbing 3-5 steps with a railing? : A Little 6 Click Score: 21    End of Session Equipment Utilized During Treatment: Gait belt Activity Tolerance: Patient tolerated treatment well Patient left: in chair;with call bell/phone within reach Nurse Communication: Mobility  status PT Visit Diagnosis: Difficulty in walking, not elsewhere classified (R26.2);Muscle weakness (generalized) (M62.81);Pain Pain - part of body: (incision, abdomen)     Time: 4008-6761 PT Time Calculation (min) (ACUTE ONLY): 28 min  Charges:  $Gait Training: 8-22 mins $Therapeutic Activity: 8-22 mins                     Wray Kearns, [place], DPT Acute Rehabilitation Services Pager [phone] Office [place] 08/24/2018, 12:51 PM

## 2018-08-24 NOTE — Care Management (Addendum)
Wound VAC authorization form and clinical information faxed to [employer] this AM to initiate insurance auth for home wound VAC.    At this time, I have been unable to secure home health nurse for this patient due to insurance contracts and staffing issues.  Phone calls made to:  [facility] (they are closed until Tuesday) [facility]  All above agencies have declined to pick up home health case.  I have spoken with Transitions of Care Supervisor, who is discussing the situation with [facility] managers.    Would recommend keeping pt through the weekend, as we are unlikely to secure home health services.  I have spoken to pt about this, and she understands the situation.  MD may have to consider setting up wound VAC dressing changes at the [facility], should home care not be a possibility.  Reinaldo Raddle, RN, BSN  Trauma/Neuro ICU Case Manager [phone]

## 2018-08-24 NOTE — Progress Notes (Signed)
    3 Days Post-Op  Subjective: Patient feels well.  Walking around her room and in the halls.  Tolerating a soft diet.  Ileostomy output seems to be decreasing from 2.5L to 1L yesterday.  Had VAC changed this morning before my arrival.  Appears to have been clean per WOC note  Objective: Vital signs in last 24 hours: Temp:  [97.8 F (36.6 C)-98.4 F (36.9 C)] 98.2 F (36.8 C) (07/03 0728) Pulse Rate:  [61-68] 68 (07/03 0728) Resp:  [20-22] 22 (07/03 0728) BP: (103-121)/(63-73) 103/67 (07/03 0728) SpO2:  [96 %-98 %] 96 % (07/03 0728) Last BM Date: 08/24/18  Intake/Output from previous day: 07/02 0701 - 07/03 0700 In: 1109.9 [P.O.:240; I.V.:764.2; IV Piggyback:105.7] Out: 1070 [Drains:20; Stool:1050] Intake/Output this shift: Total I/O In: -  Out: 525 [Stool:525]  PE: Abd: soft, minimally tender, midline VAC in place, ileostomy with bilious thin output, stoma is pink and viable.  ND  Lab Results:  Recent Labs    08/23/18 0539 08/24/18 0602  WBC 16.5* 10.2  HGB 13.6 12.7  HCT 39.9 37.0  PLT 399 377   BMET Recent Labs    08/23/18 0539 08/24/18 0602  NA 136 134*  K 4.0 3.6  CL 102 101  CO2 23 23  GLUCOSE 111* 108*  BUN 14 14  CREATININE 0.89 0.73  CALCIUM 8.7* 8.6*   PT/INR No results for input(s): LABPROT, INR in the last 72 hours. CMP     Component Value Date/Time   NA 134 (L) 08/24/2018 0602   K 3.6 08/24/2018 0602   CL 101 08/24/2018 0602   CO2 23 08/24/2018 0602   GLUCOSE 108 (H) 08/24/2018 0602   BUN 14 08/24/2018 0602   CREATININE 0.73 08/24/2018 0602   CALCIUM 8.6 (L) 08/24/2018 0602   PROT 6.7 08/21/2018 1427   ALBUMIN 3.5 08/21/2018 1427   AST 21 08/21/2018 1427   ALT 18 08/21/2018 1427   ALKPHOS 63 08/21/2018 1427   BILITOT 1.2 08/21/2018 1427   GFRNONAA >60 08/24/2018 0602   GFRAA >60 08/24/2018 0602   Lipase     Component Value Date/Time   LIPASE 22 08/21/2018 1427       Studies/Results: No results found.   Anti-infectives: Anti-infectives (From admission, onward)   Start     Dose/Rate Route Frequency Ordered Stop   08/22/18 0300  piperacillin-tazobactam (ZOSYN) IVPB 3.375 g     3.375 g 12.5 mL/hr over 240 Minutes Intravenous Every 8 hours 08/22/18 0154     08/21/18 1900  piperacillin-tazobactam (ZOSYN) IVPB 3.375 g     3.375 g 100 mL/hr over 30 Minutes Intravenous  Once 08/21/18 1859 08/21/18 2021       Assessment/Plan POD 3 Sigmodectomy, primary anastomosis, diverting loop ileostomy for perf diverticulitis- [name] - surgical path pending - vac change, M/W/F - Mobilize - soft diet -adequate oral pain control -HH being arranged for VAC and ileostomy care -cont to monitor ileostomy output, on fiber  ID- zosyn d3/5, high risk for abscess Foley- dc 7/2 VTE- lovenox, scds FEN - saline lock IV, soft diet  Dispo: hopefully home tomorrow   LOS: 3 days    Henreitta Cea , [facility] Surgery 08/24/2018, 1:02 PM Pager: [phone]

## 2018-08-24 NOTE — Consult Note (Signed)
[place] Nurse wound consult note Reason for Consult:Midline abdominal VAC dressing.  Changed today.  With ostomy pouch.   Wound type:surgical Pressure Injury POA: NA Measurement: 13 cm x 3 cm x 2.4 cm  Wound QGB:EEFEO red with 10% yellow adipose noted.  Drainage (amount, consistency, odor) Minimal serosanguinous Periwound:RLQ loop ileostomy Dressing procedure/placement/frequency: Cleanse wound with NS and pat dry.  1 piece black foam removed.  Cover with drape.  Seal immediately achieved at 125 mmHg.  Change M/W/F.  Will go home with [facility].  [facility] Nurse ostomy consult note Stoma type/location: RLQ ileostomy Stomal assessment/size: 1 3/8". Well budded  Pink patent and producing green tinged liquid.  High output.  To begin PO intake today.  Hoping her stool will thicken some.  Explained that all output will be loose with ileostomy but do not think she will need urostomy pouch with bedside drainage.  She understands and is hopeful as well.  Peristomal assessment: intact   Midline abdominal incision. Treatment options for stomal/peristomal skin: barrier ring and convex one piece pouch Output liquid green, small thicker particles.  Ostomy pouching: 1pc.convex with barrier ring Education provided: Discussed pouch changes 2-3 times weekly.  Has read written materials.  Discussed stomal output consistency.  Understands ongoing education with [place] needed Enrolled patient in [place] program: Yes  Today [place] team will follow.  Domenic Moras MSN, RN, FNP-BC CWON Wound, Ostomy, Continence Nurse Pager [phone]

## 2018-08-25 MED ORDER — DIPHENOXYLATE-ATROPINE 2.5-0.025 MG PO TABS
1.0000 | ORAL_TABLET | Freq: Four times a day (QID) | ORAL | Status: DC
  Administered 2018-08-25 – 2018-08-27 (×7): 1 via ORAL
  Filled 2018-08-25 (×7): qty 1

## 2018-08-25 NOTE — Progress Notes (Signed)
Patient ID: Linda Huff, female   DOB: 1970/11/09, 48 y.o.   MRN: [id number]    4 Days Post-Op  Subjective: No new complaints.  Doing well.  Eating well.  Objective: Vital signs in last 24 hours: Temp:  [97.4 F (36.3 C)-99.5 F (37.5 C)] 97.4 F (36.3 C) (07/04 0839) Pulse Rate:  [48-74] 62 (07/04 0839) Resp:  [16-20] 16 (07/04 0839) BP: (108-117)/(64-75) 108/68 (07/04 0839) SpO2:  [97 %-99 %] 98 % (07/04 0839) Last BM Date: 08/24/18  Intake/Output from previous day: 07/03 0701 - 07/04 0700 In: 617 [P.O.:480; IV Piggyback:137] Out: 1750 [Stool:1750] Intake/Output this shift: Total I/O In: -  Out: 325 [Drains:75; Stool:250]  PE: Abd: soft, minimally tender, ileostomy with bilious thin output, stoma is pink and viable.  midiline wound VAC in place with no issues.  Ileostomy output over 1700cc yesterday  Lab Results:  Recent Labs    08/23/18 0539 08/24/18 0602  WBC 16.5* 10.2  HGB 13.6 12.7  HCT 39.9 37.0  PLT 399 377   BMET Recent Labs    08/23/18 0539 08/24/18 0602  NA 136 134*  K 4.0 3.6  CL 102 101  CO2 23 23  GLUCOSE 111* 108*  BUN 14 14  CREATININE 0.89 0.73  CALCIUM 8.7* 8.6*   PT/INR No results for input(s): LABPROT, INR in the last 72 hours. CMP     Component Value Date/Time   NA 134 (L) 08/24/2018 0602   K 3.6 08/24/2018 0602   CL 101 08/24/2018 0602   CO2 23 08/24/2018 0602   GLUCOSE 108 (H) 08/24/2018 0602   BUN 14 08/24/2018 0602   CREATININE 0.73 08/24/2018 0602   CALCIUM 8.6 (L) 08/24/2018 0602   PROT 6.7 08/21/2018 1427   ALBUMIN 3.5 08/21/2018 1427   AST 21 08/21/2018 1427   ALT 18 08/21/2018 1427   ALKPHOS 63 08/21/2018 1427   BILITOT 1.2 08/21/2018 1427   GFRNONAA >60 08/24/2018 0602   GFRAA >60 08/24/2018 0602   Lipase     Component Value Date/Time   LIPASE 22 08/21/2018 1427       Studies/Results: No results found.  Anti-infectives: Anti-infectives (From admission, onward)   Start     Dose/Rate Route  Frequency Ordered Stop   08/22/18 0300  piperacillin-tazobactam (ZOSYN) IVPB 3.375 g     3.375 g 12.5 mL/hr over 240 Minutes Intravenous Every 8 hours 08/22/18 0154     08/21/18 1900  piperacillin-tazobactam (ZOSYN) IVPB 3.375 g     3.375 g 100 mL/hr over 30 Minutes Intravenous  Once 08/21/18 1859 08/21/18 2021       Assessment/Plan POD4 Sigmodectomy, primary anastomosis, diverting loop ileostomy for perf diverticulitis- [name] - surgical path shows diverticulitis - vac change,M/W/F - Mobilize - soft diet -adequate oral pain control -HH being arranged for VAC and ileostomy care -cont to monitor ileostomy output, on fiber.  Will add lomotil today.  Ostomy output too high for DC today.  Want to avoid dehydration at home.  ID- zosyn d4/5, high risk for abscess Foley- dc7/2 VTE- lovenox, scds FEN - saline lock IV, soft diet   LOS: 4 days    Henreitta Cea , [facility] Surgery 08/25/2018, 10:36 AM Pager: [phone]

## 2018-08-25 NOTE — Plan of Care (Signed)

## 2018-08-25 NOTE — Plan of Care (Signed)
  Problem: Clinical Measurements: Goal: Ability to maintain clinical measurements within normal limits will improve Outcome: Progressing   Problem: Activity: Goal: Risk for activity intolerance will decrease Outcome: Progressing   Problem: Coping: Goal: Level of anxiety will decrease Outcome: Progressing   

## 2018-08-26 LAB — CULTURE, BLOOD (ROUTINE X 2)
Culture: NO GROWTH
Culture: NO GROWTH
Special Requests: ADEQUATE
Special Requests: ADEQUATE

## 2018-08-26 NOTE — Progress Notes (Signed)
Patient ID: Linda Huff, female   DOB: August 28, 1970, 48 y.o.   MRN: [id number]    5 Days Post-Op  Subjective: Patient feels well today.  Ready for DC but realized [facility] not set up and each place has turned her down.  Ostomy down today to 700cc.  Pain well controlled.   Objective: Vital signs in last 24 hours: Temp:  [97.6 F (36.4 C)-99 F (37.2 C)] 97.6 F (36.4 C) (07/05 0752) Pulse Rate:  [55-65] 59 (07/05 0752) Resp:  [13-18] 13 (07/05 0752) BP: (107-134)/(68-87) 127/78 (07/05 0752) SpO2:  [96 %-100 %] 98 % (07/05 0752) Last BM Date: 08/26/18  Intake/Output from previous day: 07/04 0701 - 07/05 0700 In: -  Out: 825 [Drains:75; Stool:750] Intake/Output this shift: Total I/O In: -  Out: 125 [Stool:125]  PE: Abd: soft, appropriately tender, +BS, midline wound with VAC in place.  Ileostomy with enteric succus output.  Stoma is pink and healthy.  Lab Results:  Recent Labs    08/24/18 0602  WBC 10.2  HGB 12.7  HCT 37.0  PLT 377   BMET Recent Labs    08/24/18 0602  NA 134*  K 3.6  CL 101  CO2 23  GLUCOSE 108*  BUN 14  CREATININE 0.73  CALCIUM 8.6*   PT/INR No results for input(s): LABPROT, INR in the last 72 hours. CMP     Component Value Date/Time   NA 134 (L) 08/24/2018 0602   K 3.6 08/24/2018 0602   CL 101 08/24/2018 0602   CO2 23 08/24/2018 0602   GLUCOSE 108 (H) 08/24/2018 0602   BUN 14 08/24/2018 0602   CREATININE 0.73 08/24/2018 0602   CALCIUM 8.6 (L) 08/24/2018 0602   PROT 6.7 08/21/2018 1427   ALBUMIN 3.5 08/21/2018 1427   AST 21 08/21/2018 1427   ALT 18 08/21/2018 1427   ALKPHOS 63 08/21/2018 1427   BILITOT 1.2 08/21/2018 1427   GFRNONAA >60 08/24/2018 0602   GFRAA >60 08/24/2018 0602   Lipase     Component Value Date/Time   LIPASE 22 08/21/2018 1427       Studies/Results: No results found.  Anti-infectives: Anti-infectives (From admission, onward)   Start     Dose/Rate Route Frequency Ordered Stop   08/22/18 0300   piperacillin-tazobactam (ZOSYN) IVPB 3.375 g     3.375 g 12.5 mL/hr over 240 Minutes Intravenous Every 8 hours 08/22/18 0154 08/26/18 2359   08/21/18 1900  piperacillin-tazobactam (ZOSYN) IVPB 3.375 g     3.375 g 100 mL/hr over 30 Minutes Intravenous  Once 08/21/18 1859 08/21/18 2021       Assessment/Plan POD5Sigmodectomy, primary anastomosis, diverting loop ileostomy for perf diverticulitis- [name] - surgical path shows diverticulitis - vac change,M/W/F - Mobilize -soft diet -adequate oral pain control -HH unable to arranged.  Will have to keep patient til tomorrow unfortunately to get this straightened out before she goes home. -cont to monitor ileostomy output, on fiber.  Lomotil added.  Ostomy output improving.  Want to avoid dehydration at home.  ID- zosyn d5/5, high risk for abscess Foley- dc7/2 VTE- lovenox, scds FEN -saline lock IV, soft diet   LOS: 5 days    Henreitta Cea , [facility] Surgery 08/26/2018, 11:44 AM Pager: [phone]

## 2018-08-26 NOTE — Plan of Care (Signed)
Patient looking forward to discharging however there is a home health issue. Patient still having some drainage from ostomy but decreasing. Drinking and eating well taking supplemental ensure. Draining own ostomy bag. Still needs some guidance on changing bag. Hopefully will set up for discharge tomorrow. See SW note.

## 2018-08-26 NOTE — Progress Notes (Signed)
Feeling more pain, she ambulated and coughed, sore abdomen. Medicated for pain. Wound vac with scant amount,

## 2018-08-26 NOTE — Progress Notes (Signed)
Patient taught about ileostomy bag and how to empty it out. Assisted with emptying. Next time patient went to bathroom and emptied  Feels that she has a good handle on how to empty bag. Is tearful that she cannot go home today.  Ambulated in room

## 2018-08-27 MED ORDER — SIMETHICONE 80 MG PO CHEW: 80.0000 mg | CHEWABLE_TABLET | Freq: Four times a day (QID) | ORAL | 0 refills | Status: DC | PRN

## 2018-08-27 MED ORDER — ACETAMINOPHEN 500 MG PO TABS: 1000.0000 mg | ORAL_TABLET | Freq: Four times a day (QID) | ORAL | 0 refills | Status: DC | PRN

## 2018-08-27 MED ORDER — PSYLLIUM 95 % PO PACK: 1.0000 | PACK | Freq: Every day | ORAL | Status: DC

## 2018-08-27 MED ORDER — DIPHENOXYLATE-ATROPINE 2.5-0.025 MG PO TABS: 1.0000 | ORAL_TABLET | Freq: Four times a day (QID) | ORAL | 0 refills | Status: DC

## 2018-08-27 MED ORDER — OXYCODONE HCL 5 MG PO TABS: 5.0000 mg | ORAL_TABLET | Freq: Four times a day (QID) | ORAL | 0 refills | Status: DC | PRN

## 2018-08-27 MED ORDER — ONDANSETRON 4 MG PO TBDP: 4.0000 mg | ORAL_TABLET | Freq: Four times a day (QID) | ORAL | 0 refills | Status: DC | PRN

## 2018-08-27 NOTE — Progress Notes (Signed)
Patient ID: Linda Huff, female   DOB: 1971-01-01, 48 y.o.   MRN: [id number]    6 Days Post-Op  Subjective: Feels well, ready to go home  Objective: Vital signs in last 24 hours: Temp:  [98.1 F (36.7 C)-100 F (37.8 C)] 98.1 F (36.7 C) (07/06 0801) Pulse Rate:  [66-71] 66 (07/06 0801) BP: (114-133)/(64-81) 114/65 (07/06 0801) SpO2:  [95 %-98 %] 97 % (07/06 0801) Last BM Date: 08/28/18  Intake/Output from previous day: 07/05 0701 - 07/06 0700 In: 1080 [P.O.:1080] Out: 575 [Stool:575] Intake/Output this shift: Total I/O In: 360 [P.O.:360] Out: 100 [Stool:100]  PE: Abd: soft, appropriately tender, +BS, midline wound with VAC in place.  Ileostomy with enteric succus output.  Stoma is pink and healthy.  Lab Results:  No results for input(s): WBC, HGB, HCT, PLT in the last 72 hours. BMET No results for input(s): NA, K, CL, CO2, GLUCOSE, BUN, CREATININE, CALCIUM in the last 72 hours. PT/INR No results for input(s): LABPROT, INR in the last 72 hours. CMP     Component Value Date/Time   NA 134 (L) 08/24/2018 0602   K 3.6 08/24/2018 0602   CL 101 08/24/2018 0602   CO2 23 08/24/2018 0602   GLUCOSE 108 (H) 08/24/2018 0602   BUN 14 08/24/2018 0602   CREATININE 0.73 08/24/2018 0602   CALCIUM 8.6 (L) 08/24/2018 0602   PROT 6.7 08/21/2018 1427   ALBUMIN 3.5 08/21/2018 1427   AST 21 08/21/2018 1427   ALT 18 08/21/2018 1427   ALKPHOS 63 08/21/2018 1427   BILITOT 1.2 08/21/2018 1427   GFRNONAA >60 08/24/2018 0602   GFRAA >60 08/24/2018 0602   Lipase     Component Value Date/Time   LIPASE 22 08/21/2018 1427       Studies/Results: No results found.  Anti-infectives: Anti-infectives (From admission, onward)   Start     Dose/Rate Route Frequency Ordered Stop   08/22/18 0300  piperacillin-tazobactam (ZOSYN) IVPB 3.375 g  Status:  Discontinued     3.375 g 12.5 mL/hr over 240 Minutes Intravenous Every 8 hours 08/22/18 0154 08/26/18 1423   08/21/18 1900   piperacillin-tazobactam (ZOSYN) IVPB 3.375 g     3.375 g 100 mL/hr over 30 Minutes Intravenous  Once 08/21/18 1859 08/21/18 2021       Assessment/Plan POD6Sigmodectomy, primary anastomosis, diverting loop ileostomy for perf diverticulitis- Dr. Georgette Dover - surgical path shows diverticulitis - vac change,M/W/F - Mobilize -soft diet -adequate oral pain control -she can go home as soon as a plan is worked out for [facility] of wound vac and ostomy at home- reportedly insurance denied [place] which has prolonged her hospitalization.  -cont to monitor ileostomy output, on fiber.  Lomotil added.  Ostomy output improving.  Want to avoid dehydration at home.  ID- zosyn x 5 d, high risk for abscess Foley- dc7/2 VTE- lovenox, scds FEN -saline lock IV, soft diet   LOS: 6 days    Clovis Riley , MD [facility] Surgery 08/27/2018, 9:19 AM

## 2018-08-27 NOTE — Progress Notes (Signed)
Physical Therapy Treatment Patient Details Name: Linda Huff MRN: [id number] DOB: 20-Feb-1971 Today's Date: 08/27/2018    History of Present Illness Patient is a 48 y/o female s/p sigmodectomy, primary anastomosis and diverting loop ileostomy for perf diverticulitis.    PT Comments    Patient doing well this morning. Eager to return home. Reports walking over the weekend and feeling much better with regards to pain. Able to empty ileostomy independently. Tolerated gait training and stair training Mod I for safety to manage lines. Discussed exercise program and walking at home. Pt has met all goals and no longer needs skilled therapy services. All education completed. Discharge from therapy.    Follow Up Recommendations  No PT follow up     Equipment Recommendations  None recommended by PT    Recommendations for Other Services       Precautions / Restrictions Precautions Precautions: Other (comment) Precaution Comments: ileostomy; wound vac Restrictions Weight Bearing Restrictions: No    Mobility  Bed Mobility               General bed mobility comments: Up in chair upon PT arrival.  Transfers Overall transfer level: Modified independent Equipment used: None Transfers: Sit to/from Stand Sit to Stand: Modified independent (Device/Increase time)         General transfer comment: Stood from chair in guarded fashion without difficulty.  Ambulation/Gait Ambulation/Gait assistance: Modified independent (Device/Increase time) Gait Distance (Feet): 400 Feet Assistive device: None Gait Pattern/deviations: Decreased stride length;Step-through pattern   Gait velocity interpretation: 1.31 - 2.62 ft/sec, indicative of limited community ambulator General Gait Details: Guarded gait with more upright posture today.   Stairs Stairs: Yes Stairs assistance: Modified independent (Device/Increase time) Stair Management: One rail Left;Alternating pattern Number of  Stairs: 4 General stair comments: Good safety negotiating steps.   Wheelchair Mobility    Modified Rankin (Stroke Patients Only)       Balance Overall balance assessment: No apparent balance deficits (not formally assessed)                                          Cognition Arousal/Alertness: Awake/alert Behavior During Therapy: WFL for tasks assessed/performed Overall Cognitive Status: Within Functional Limits for tasks assessed                                        Exercises      General Comments        Pertinent Vitals/Pain Pain Assessment: Faces Faces Pain Scale: Hurts a little bit Pain Location: abdomen Pain Descriptors / Indicators: Sore;Tender Pain Intervention(s): Monitored during session    Home Living                      Prior Function            PT Goals (current goals can now be found in the care plan section) Progress towards PT goals: Goals met/education completed, patient discharged from PT    Frequency    Min 3X/week      PT Plan Current plan remains appropriate    Co-evaluation              AM-PAC PT "6 Clicks" Mobility   Outcome Measure  Help needed turning from your back to your side while in  a flat bed without using bedrails?: None Help needed moving from lying on your back to sitting on the side of a flat bed without using bedrails?: A Little Help needed moving to and from a bed to a chair (including a wheelchair)?: None Help needed standing up from a chair using your arms (e.g., wheelchair or bedside chair)?: None Help needed to walk in hospital room?: None Help needed climbing 3-5 steps with a railing? : None 6 Click Score: 23    End of Session   Activity Tolerance: Patient tolerated treatment well Patient left: in chair;with call bell/phone within reach Nurse Communication: Mobility status PT Visit Diagnosis: Difficulty in walking, not elsewhere classified (R26.2);Muscle  weakness (generalized) (M62.81);Pain Pain - part of body: (abdomen)     Time: 7615-1834 PT Time Calculation (min) (ACUTE ONLY): 16 min  Charges:  $Gait Training: 8-22 mins                     Wray Kearns, PT, DPT Acute Rehabilitation Services Pager [phone] Office [place] 08/27/2018, 8:28 AM

## 2018-08-27 NOTE — Consult Note (Signed)
[facility] Nurse wound follow up Wound type: surgical  Measurement: 14cm x 2cm x 2cm with two small pockets at the distal aspect of the wound bed. PA Brook at the bedside to assess Wound IWL:NLGXQ, subcutaneous tissue Drainage (amount, consistency, odor) minimal, serosanguinous  Periwound: intact, ostomy to the right lateral aspect  Dressing procedure/placement/frequency: Saline moist to moist dressing applied, bedside nurse to teach husband dressing changes when he arrives. Covered with ABD pad and secured with tape.  [facility] Nurse ostomy follow up Stoma type/location: RLQ, end ileostomy Stomal assessment/size: 1 3/8" round, budded, pink, moist Peristomal assessment: intact  Treatment options for stomal/peristomal skin: 2" barrier ring to aid in seal Output liquid green Ostomy pouching: 1pc.convex  Education provided:  Explained stoma characteristics (budded, flush, color, texture, care) Patient performed pouch change (cutting new skin barrier, measuring stoma, cleaning peristomal skin and stoma, use of barrier ring) Reiterated need to emptying when 1/3 to 1/2 full and how to empty Demonstrated hooking pouch to nighttime drainage bag Discussed risk of peristomal hernia Provided patient with [employer] and marked items currently using; marked ostomy and wound care supplies for patient, she will not have [facility] at [facility]. Will need to order her own supplies.  Enrolled patient in [facility] Discharge program: Yes  5 pouches and barrier rings sent home with patient Kerlix, saline, tape, gauze, ABD pads sent home with patient.   [place], [place], [place]

## 2018-08-27 NOTE — Discharge Summary (Signed)
[facility] Discharge Summary   Patient ID: Linda SchillingJennifer H Pecina MRN: [id number] DOB/AGE: 1970-06-11 48 y.o.  Admit date: 08/21/2018 Discharge date: 08/27/2018  Admitting Diagnosis: Perforated viscus with peritonitis; probable perforated sigmoid diverticulitis  Discharge Diagnosis Patient Active Problem List   Diagnosis Date Noted  . Perforation of sigmoid colon due to diverticulitis 08/22/2018  . Status post exploratory laparotomy 08/21/2018  . Bradycardia 09/12/2014    Consultants None  Imaging: No results found.  Procedures Dr. Corliss Skainssuei (08/22/18) - Exploratory laparotomy, sigmoid colectomy with primary anastomosis, diverting loop ileostomy, incidental appendectomy, placement of wound VAC (30 cm)   Hospital Course:  Linda SchillingJennifer H Cobert is a 48yo female who [facility] with a few days of worsening abdominal pain.  She had peritonitis on exam. Workup included CT scan which showed probable perforated sigmoid diverticulitis with generalized free fluid/ inflammation/ intraloop abscesses/ pelvic abscess.  Patient was admitted and underwent procedure listed above.  Tolerated procedure well and was transferred to a progressive care floor. She was kept on zosyn x5 days postoperatively due to increased risk of abscess development. She did have a mild postoperative ileus but this improved with time and diet was advanced as tolerated. She did develop high ileostomy output requiring metamucil and lomotil to keep output at a safe volume to avoid dehydration. On POD6, the patient was voiding well, tolerating diet, having bowel function, ambulating well, pain well controlled, vital signs stable and felt stable for discharge home.  Patient will follow up as below and knows to call with questions or concerns.    Physical Exam: Gen:  Alert, NAD, pleasant HEENT: EOM's intact, pupils equal and round Pulm:  effort normal Psych: A&Ox3  Skin: no rashes noted, warm and dry Abd: Soft,  nondistended, +BS, nontender, ostomy pink/viable with thin light brown liquid stool in ostomy pouch (no stool), open midline incision pink with no purulence or cellulitis/ two pin point openings at distal aspect of wound that track about 2cm proximally     Allergies as of 08/27/2018   No Known Allergies     Medication List    TAKE these medications   acetaminophen 500 MG tablet Commonly known as: TYLENOL Take 2 tablets (1,000 mg total) by mouth every 6 (six) hours as needed.   diphenoxylate-atropine 2.5-0.025 MG tablet Commonly known as: LOMOTIL Take 1 tablet by mouth 4 (four) times daily.   ibuprofen 200 MG tablet Commonly known as: ADVIL Take 200-400 mg by mouth every 6 (six) hours as needed for moderate pain.   ondansetron 4 MG disintegrating tablet Commonly known as: ZOFRAN-ODT Take 1 tablet (4 mg total) by mouth every 6 (six) hours as needed for nausea.   oxyCODONE 5 MG immediate release tablet Commonly known as: Oxy IR/ROXICODONE Take 1 tablet (5 mg total) by mouth every 6 (six) hours as needed for severe pain.   psyllium 95 % Pack Commonly known as: HYDROCIL/METAMUCIL Take 1 packet by mouth daily. Start taking on: August 28, 2018   simethicone 80 MG chewable tablet Commonly known as: MYLICON Chew 1 tablet (80 mg total) by mouth 4 (four) times daily as needed for flatulence.            Durable Medical Equipment  (From admission, onward)         Start     Ordered   08/23/18 1102  For home use only DME Negative pressure wound device  Once    Question Answer Comment  Frequency of dressing change 3  times per week   Length of need 3 Months   Dressing type Foam   Amount of suction 125 mm/Hg   Pressure application Continuous pressure   Supplies 10 canisters and 15 dressings per month for duration of therapy      08/23/18 1102           Follow-up Information    Donnie Mesa, MD. Go on 09/21/2018.   Specialty: General Surgery Why: Your appointment is  09/21/2018 @ 9:20am Please arrive 30 minutes prior to your appointment to check in and fill out paperwork. Bring photo ID and insurance information. Contact information: [address] [address] [phone]           Signed: Wellington Hampshire, [facility] Surgery 08/27/2018, 9:22 AM Pager: [phone] Mon-Thurs 7:00 am-4:30 pm Fri 7:00 am -11:30 AM Sat-Sun 7:00 am-11:30 am

## 2018-08-27 NOTE — Progress Notes (Signed)
Discharge instructions were reviewed. The patient and her husband were taught to complete wet to dry dressing. All questions answered. PIV removed. The pt was taken down in a wheelchair and discharged to husband.

## 2018-08-27 NOTE — Progress Notes (Signed)
No acute events overnight. Pain control with oxycodone. Planning for possible discharge home today.

## 2018-08-27 NOTE — TOC Transition Note (Signed)
Transition of Care [facility]) - CM/SW Discharge Note   Patient Details  Name: RHYLYNN PERDOMO MRN: [id number] Date of Birth: 04/29/1970  [facility]) CM/SW Contact:  Ella Bodo, RN Phone Number: 08/27/2018, 1:56 PM   Clinical Narrative:  Unable to secure home health for pt due to insurance contracts and staffing issues.  MD agrees with plan to dc home with wet to dry dressings; husband brought in for teaching by bedside RN prior to dc. Notified Olivia Mackie with [employer] of cancellation of wound VAC and need for VAC pickup.       Final next level of care: Home/Self Care Barriers to Discharge: Other (comment)(Unable to secure needed home health for wound and colostomy care)                                                                Readmission Risk Interventions No flowsheet data found.  Reinaldo Raddle, RN, BSN  Trauma/Neuro ICU Case Manager [phone]

## 2018-10-11 ENCOUNTER — Ambulatory Visit: Payer: Self-pay | Admitting: Surgery

## 2018-10-11 ENCOUNTER — Other Ambulatory Visit: Payer: Self-pay | Admitting: Surgery

## 2018-10-11 DIAGNOSIS — K5732 Diverticulitis of large intestine without perforation or abscess without bleeding: Secondary | ICD-10-CM

## 2018-10-11 NOTE — H&P (Signed)
History of Present Illness Linda Huff. Linda Kampe MD; 10/11/2018 3:03 PM) The patient is a 48 year old female presenting for a post-operative visit. This is a 48 year old female in excellent health who presents after presenting to the emergency department on 6/30 with several days of worsening lower abdominal pain. She had obvious peritonitis on examination. CT scan revealed probable perforated sigmoid diverticulitis with general peritonitis and multiple intra-loop abscesses. She underwent exploratory laparotomy, sigmoid colectomy with primary anastomosis, diverting loop ileostomy, incidental appendectomy. The patient was discharged home on 08/27/18. The patient still has limited energy, but this is improving. She has been walking for exercise. Her appetite has returned normal. She is using Ensure drinks as well as a high protein diet. She is no longer using any pain medication. When she eats certain foods (lasagna) she develops fairly high output from her ileostomy. Occasionally she feels light-headed when she gets up from a chair. Her wound has healed completely.  Over the last couple days, she has noticed some intermittent left lower quadrant abdominal pain. No swelling. No change in the volume of her ileostomy output. She has noticed that the output seems a little bit thicker. She has not noticed any gross blood.   Problem List/Past Medical Linda Key K. Signe Tackitt, MD; 10/11/2018 3:03 PM) PERFORATION OF SIGMOID COLON DUE TO DIVERTICULITIS (K57.20)  S/P EXPLORATORY LAPAROTOMY (Z61.096)   Past Surgical History (Linda Platner K. Josalyn Dettmann, MD; 10/11/2018 3:03 PM) Cataract Surgery  Right. Colon Removal - Partial   Diagnostic Studies History Linda Huff. Linda Rendleman, MD; 10/11/2018 3:03 PM) Colonoscopy  5-10 years ago Mammogram  within last year Pap Smear  1-5 years ago  Allergies [employer], RMA; 10/11/2018 1:35 PM) No Known Drug Allergies  [08/31/2018]: Allergies Reconciled   Medication  History [employer], RMA; 10/11/2018 1:35 PM) Saline (0.9% Solution, 5-10 two times daily, Taken starting 08/28/2018) Active. Tylenol (500MG  Capsule, Oral) Active. Lomotil (2.5-0.025MG  Tablet, Oral) Active. Ondansetron (4MG  Tablet Disint, Oral) Active. Medications Reconciled  Social History Linda Huff. Linda Overholt, MD; 10/11/2018 3:03 PM) Alcohol use  Moderate alcohol use. Caffeine use  Carbonated beverages, Coffee, Tea. Tobacco use  Never smoker.  Family History Linda Huff. Linda Spradley, MD; 10/11/2018 3:03 PM) Alcohol Abuse  Father. Breast Cancer  Family Members In General. Colon Cancer  Family Members In General. Hypertension  Mother. Melanoma  Father.  Pregnancy / Birth History Linda Huff. [name], MD; 10/11/2018 3:03 PM) Age at menarche  31 years. Contraceptive History  Intrauterine device, Oral contraceptives. Gravida  3 Length (months) of breastfeeding  3-6 Maternal age  79-30 Para  3  Other Problems Linda Huff. Linda Bello, MD; 10/11/2018 3:03 PM) Diverticulosis   Vitals Linda Huff RMA; 10/11/2018 1:36 PM) 10/11/2018 1:35 PM Weight: 133.2 lb Height: 64in Body Surface Area: 1.65 m Body Mass Index: 22.86 kg/m  Temp.: 98.107F (Temporal)  Pulse: 70 (Regular)  P.OX: 99% (Room air) BP: 112/70(Sitting, Left Arm, Standard)       Physical Exam Linda Key K. Khole Arterburn MD; 10/11/2018 3:11 PM) The physical exam findings are as follows: Note: WDWN in NAD Eyes: Pupils equal, round; sclera anicteric HENT: Oral mucosa moist; good dentition Neck: No masses palpated, no thyromegaly Lungs: CTA bilaterally; normal respiratory effort CV: Regular rate and rhythm; no murmurs; extremities well-perfused with no edema Abd: +bowel sounds, soft, incision healed RLQ ileostomy - pink with good output Minimal LLQ tenderness; no palpable masses Skin: Warm, dry; no sign of jaundice Psychiatric - alert and oriented x 4; calm mood and affect    Assessment & Plan  (  Linda ArmsMatthew K. Jaamal Farooqui MD; 10/11/2018 3:11 PM)  PERFORATION OF SIGMOID COLON DUE TO DIVERTICULITIS (K57.20)   S/P EXPLORATORY LAPAROTOMY (O13.086(Z98.890)  Current Plans CT ABDOMEN AND PELVIS W CONTRAST ([id number]) Schedule for Surgery - Reversal of loop ileostomy. The surgical procedure has been discussed with the patient. Potential risks, benefits, alternative treatments, and expected outcomes have been explained. All of the patient's questions at this time have been answered. The likelihood of reaching the patient's treatment goal is good. The patient understand the proposed surgical procedure and wishes to proceed. Note:We will obtain a CT scan of the abdomen and pelvis with oral as well as rectal contrast to evaluate her sigmoid colon anastomosis as well as her left-sided pain. If there are no significant findings and the anastomosis seems to be patent without sign of leak, we will plan her ileostomy reversal. I spent time today discussing her procedure. We will let her know our decision regarding surgery after the CT scan.    Linda ArmsMatthew K. Corliss Skainssuei, MD, [facility]  General/ Trauma Surgery Beeper [phone]  10/11/2018 3:29 PM

## 2018-10-19 ENCOUNTER — Ambulatory Visit
Admission: RE | Admit: 2018-10-19 | Discharge: 2018-10-19 | Disposition: A | Discharge status: Home / Self Care | Payer: BC Managed Care – PPO | Source: Ambulatory Visit | Attending: Surgery | Admitting: Surgery

## 2018-10-19 ENCOUNTER — Telehealth: Payer: Self-pay | Admitting: General Practice

## 2018-10-19 ENCOUNTER — Other Ambulatory Visit: Payer: Self-pay

## 2018-10-19 DIAGNOSIS — K5732 Diverticulitis of large intestine without perforation or abscess without bleeding: Secondary | ICD-10-CM

## 2018-10-19 MED ORDER — IOPAMIDOL (ISOVUE-300) INJECTION 61%
100.0000 mL | Freq: Once | INTRAVENOUS | Status: AC | PRN
  Administered 2018-10-19: 100 mL via INTRAVENOUS

## 2018-10-19 NOTE — Progress Notes (Signed)
Please call the patient and let them know that their CT scan looked good and we can proceed with scheduling her surgery

## 2018-10-19 NOTE — Telephone Encounter (Signed)
Called pt to schedule a New Patient appt with Dr. Jonni Sanger, no answer, LVM.

## 2018-10-25 ENCOUNTER — Other Ambulatory Visit: Payer: Self-pay

## 2018-10-25 DIAGNOSIS — Z20822 Contact with and (suspected) exposure to covid-19: Secondary | ICD-10-CM

## 2018-10-26 LAB — NOVEL CORONAVIRUS, NAA: SARS-CoV-2, NAA: NOT DETECTED

## 2018-11-06 NOTE — Pre-Procedure Instructions (Signed)
[facility] [place], [place] - [address] [place] [address] Phone: [phone] Fax: [phone]    Your procedure is scheduled on Wednesday September 23rd.  Report to [facility] Main Entrance "A" at 11:00 A.M., and check in at the Admitting office.  Call this number if you have problems the morning of surgery:  [phone]  Call [phone] if you have any questions prior to your surgery date Monday-Friday 8am-4pm    Remember:  Do not eat after midnight the night before your surgery  You may drink clear liquids until 10:00AM  the morning of your surgery.   Clear liquids allowed are: Water, Non-Citrus Juices (without pulp), Carbonated Beverages, Clear Tea, Black Coffee Only, and Gatorade. Your surgeon has also prescribed an Ensure Pre-surgery Carbohydrate drink for you to drink the night before your surgery and the morning of your surgery. Please drink all in one sitting, do not sip. Complete Ensure by 10:00 AM the morning of surgery. Patient Instructions  . The night before surgery:  o No food after midnight. ONLY clear liquids after midnight  . The day of surgery (if you do NOT have diabetes):  o Drink ONE (1) Pre-Surgery Clear Ensure as directed.   o This drink was given to you during your hospital  pre-op appointment visit. o The pre-op nurse will instruct you on the time to drink the  Pre-Surgery Ensure depending on your surgery time. o Finish the drink at the designated time by the pre-op nurse-10 AM.  o Nothing else to drink after completing the  Pre-Surgery Clear Ensure.         If you have questions, please contact your surgeon's office.     Take these medicines the morning of surgery with A SIP OF WATER -NONE  7 days prior to surgery STOP taking any Aspirin (unless otherwise instructed by your surgeon), Aleve, Naproxen, Ibuprofen, Motrin, Advil, Goody's, BC's, all herbal  medications, fish oil, and all vitamins.    The Morning of Surgery  Do not wear jewelry, make-up or nail polish.  Do not wear lotions, powders, or perfumes/colognes, or deodorant  Do not shave 48 hours prior to surgery.  Men may shave face and neck.  Do not bring valuables to the hospital.  [facility] is not responsible for any belongings or valuables.  If you are a smoker, DO NOT Smoke 24 hours prior to surgery IF you wear a CPAP at night please bring your mask, tubing, and machine the morning of surgery   Remember that you must have someone to transport you home after your surgery, and remain with you for 24 hours if you are discharged the same day.   Contacts, glasses, hearing aids, dentures or bridgework may not be worn into surgery.    Leave your suitcase in the car.  After surgery it may be brought to your room.  For patients admitted to the hospital, discharge time will be determined by your treatment team.  Patients discharged the day of surgery will not be allowed to drive home.    Special instructions:   [place]- Preparing For Surgery  Before surgery, you can play an important role. Because skin is not sterile, your skin needs to be as free of germs as possible. You can reduce the number of germs on your skin by washing with CHG (chlorahexidine gluconate) Soap before surgery.  CHG is an antiseptic  cleaner which kills germs and bonds with the skin to continue killing germs even after washing.    Oral Hygiene is also important to reduce your risk of infection.  Remember - BRUSH YOUR TEETH THE MORNING OF SURGERY WITH YOUR REGULAR TOOTHPASTE  Please do not use if you have an allergy to CHG or antibacterial soaps. If your skin becomes reddened/irritated stop using the CHG.  Do not shave (including legs and underarms) for at least 48 hours prior to first CHG shower. It is OK to shave your face.  Please follow these instructions carefully.   1. Shower the NIGHT BEFORE  SURGERY and the MORNING OF SURGERY with CHG Soap.   2. If you chose to wash your hair, wash your hair first as usual with your normal shampoo.  3. After you shampoo, rinse your hair and body thoroughly to remove the shampoo.  4. Use CHG as you would any other liquid soap. You can apply CHG directly to the skin and wash gently with a scrungie or a clean washcloth.   5. Apply the CHG Soap to your body ONLY FROM THE NECK DOWN.  Do not use on open wounds or open sores. Avoid contact with your eyes, ears, mouth and genitals (private parts). Wash Face and genitals (private parts)  with your normal soap.   6. Wash thoroughly, paying special attention to the area where your surgery will be performed.  7. Thoroughly rinse your body with warm water from the neck down.  8. DO NOT shower/wash with your normal soap after using and rinsing off the CHG Soap.  9. Pat yourself dry with a CLEAN TOWEL.  10. Wear CLEAN PAJAMAS to bed the night before surgery, wear comfortable clothes the morning of surgery  11. Place CLEAN SHEETS on your bed the night of your first shower and DO NOT SLEEP WITH PETS.    Day of Surgery:  Do not apply any deodorants/lotions. Please shower the morning of surgery with the CHG soap  Please wear clean clothes to the hospital/surgery center.   Remember to brush your teeth WITH YOUR REGULAR TOOTHPASTE.   Please read over the following fact sheets that you were given.

## 2018-11-07 ENCOUNTER — Encounter (HOSPITAL_COMMUNITY): Payer: Self-pay

## 2018-11-07 ENCOUNTER — Encounter (HOSPITAL_COMMUNITY)
Admission: RE | Admit: 2018-11-07 | Discharge: 2018-11-07 | Disposition: A | Discharge status: Home / Self Care | Payer: BC Managed Care – PPO | Source: Ambulatory Visit | Attending: Surgery | Admitting: Surgery

## 2018-11-07 ENCOUNTER — Other Ambulatory Visit: Payer: Self-pay

## 2018-11-07 DIAGNOSIS — Z01812 Encounter for preprocedural laboratory examination: Secondary | ICD-10-CM | POA: Diagnosis present

## 2018-11-07 DIAGNOSIS — Z01810 Encounter for preprocedural cardiovascular examination: Secondary | ICD-10-CM | POA: Diagnosis present

## 2018-11-07 LAB — CBC
HCT: 41.4 % (ref 36.0–46.0)
Hemoglobin: 13.8 g/dL (ref 12.0–15.0)
MCH: 29.1 pg (ref 26.0–34.0)
MCHC: 33.3 g/dL (ref 30.0–36.0)
MCV: 87.2 fL (ref 80.0–100.0)
Platelets: 314 10*3/uL (ref 150–400)
RBC: 4.75 MIL/uL (ref 3.87–5.11)
RDW: 13.5 % (ref 11.5–15.5)
WBC: 4 10*3/uL (ref 4.0–10.5)
nRBC: 0 % (ref 0.0–0.2)

## 2018-11-07 LAB — BASIC METABOLIC PANEL
Anion gap: 9 (ref 5–15)
BUN: 7 mg/dL (ref 6–20)
CO2: 20 mmol/L — ABNORMAL LOW (ref 22–32)
Calcium: 9.4 mg/dL (ref 8.9–10.3)
Chloride: 107 mmol/L (ref 98–111)
Creatinine, Ser: 0.76 mg/dL (ref 0.44–1.00)
GFR calc Af Amer: >60 mL/min (ref >60)
GFR calc non Af Amer: >60 mL/min (ref >60)
Glucose, Bld: 89 mg/dL (ref 70–99)
Potassium: 4 mmol/L (ref 3.5–5.1)
Sodium: 136 mmol/L (ref 135–145)

## 2018-11-07 NOTE — Progress Notes (Addendum)
  Coronavirus Screening Scheduled for COVID test Saturday Have you experienced the following symptoms:  Cough yes/no: No Fever (>100.69F)  yes/no: No Runny nose yes/no: No Sore throat yes/no: No Difficulty breathing/shortness of breath  yes/no: No Loss of smell or taste-No Have you or a family member traveled in the last 14 days and where? yes/no: No  PCP - denies  Cardiologist - denies  Chest x-ray - NA  EKG - Today (No changes in comparison with EKG done in 2016)  Stress Test - denies  ECHO - Not done  Cardiac Cath - denies  AICD-denies PM-denies LOOP-denies  Sleep Study - denies CPAP - NA  LABS-CBC, BMP  ASA-denies  ERAS-Pre-surgery Ensure given with instructions  HA1C-denies Fasting Blood Sugar -  Checks Blood Sugar _____ times a day  Anesthesia-N  Pt denies having chest pain, sob, or fever at this time. All instructions explained to the pt, with a verbal understanding of the material. Pt agrees to go over the instructions while at home for a better understanding. Pt also instructed to self quarantine after being tested for COVID-19. The opportunity to ask questions was provided.

## 2018-11-10 ENCOUNTER — Other Ambulatory Visit (HOSPITAL_COMMUNITY)
Admission: RE | Admit: 2018-11-10 | Discharge: 2018-11-10 | Disposition: A | Discharge status: Home / Self Care | Payer: BC Managed Care – PPO | Source: Ambulatory Visit | Attending: Surgery | Admitting: Surgery

## 2018-11-10 DIAGNOSIS — Z20828 Contact with and (suspected) exposure to other viral communicable diseases: Secondary | ICD-10-CM | POA: Diagnosis not present

## 2018-11-10 DIAGNOSIS — Z01812 Encounter for preprocedural laboratory examination: Secondary | ICD-10-CM | POA: Diagnosis not present

## 2018-11-11 LAB — NOVEL CORONAVIRUS, NAA (HOSP ORDER, SEND-OUT TO REF LAB; TAT 18-24 HRS): SARS-CoV-2, NAA: NOT DETECTED

## 2018-11-14 ENCOUNTER — Encounter (HOSPITAL_COMMUNITY): Payer: Self-pay

## 2018-11-14 ENCOUNTER — Encounter (HOSPITAL_COMMUNITY)
Admission: RE | Disposition: A | Discharge status: Home / Self Care | Payer: Self-pay | Source: Home / Self Care | Attending: Surgery

## 2018-11-14 ENCOUNTER — Inpatient Hospital Stay (HOSPITAL_COMMUNITY): Payer: BC Managed Care – PPO | Admitting: Vascular Surgery

## 2018-11-14 ENCOUNTER — Other Ambulatory Visit: Payer: Self-pay

## 2018-11-14 ENCOUNTER — Inpatient Hospital Stay (HOSPITAL_COMMUNITY)
Admission: RE | Admit: 2018-11-14 | Discharge: 2018-11-16 | DRG: 331 | Disposition: A | Discharge status: Home / Self Care | Payer: BC Managed Care – PPO | Attending: Surgery | Admitting: Surgery

## 2018-11-14 ENCOUNTER — Inpatient Hospital Stay (HOSPITAL_COMMUNITY): Payer: BC Managed Care – PPO | Admitting: Certified Registered"

## 2018-11-14 DIAGNOSIS — Z811 Family history of alcohol abuse and dependence: Secondary | ICD-10-CM

## 2018-11-14 DIAGNOSIS — Z8719 Personal history of other diseases of the digestive system: Secondary | ICD-10-CM

## 2018-11-14 DIAGNOSIS — Z803 Family history of malignant neoplasm of breast: Secondary | ICD-10-CM

## 2018-11-14 DIAGNOSIS — Z932 Ileostomy status: Secondary | ICD-10-CM

## 2018-11-14 DIAGNOSIS — K5790 Diverticulosis of intestine, part unspecified, without perforation or abscess without bleeding: Secondary | ICD-10-CM | POA: Diagnosis present

## 2018-11-14 DIAGNOSIS — Z432 Encounter for attention to ileostomy: Principal | ICD-10-CM

## 2018-11-14 DIAGNOSIS — Z808 Family history of malignant neoplasm of other organs or systems: Secondary | ICD-10-CM

## 2018-11-14 DIAGNOSIS — Z8249 Family history of ischemic heart disease and other diseases of the circulatory system: Secondary | ICD-10-CM

## 2018-11-14 DIAGNOSIS — Z79899 Other long term (current) drug therapy: Secondary | ICD-10-CM

## 2018-11-14 DIAGNOSIS — Z800 Family history of malignant neoplasm of digestive organs: Secondary | ICD-10-CM

## 2018-11-14 HISTORY — PX: ILEOSTOMY CLOSURE: SHX1784

## 2018-11-14 LAB — CBC
HCT: 39.2 % (ref 36.0–46.0)
Hemoglobin: 13.7 g/dL (ref 12.0–15.0)
MCH: 29.8 pg (ref 26.0–34.0)
MCHC: 34.9 g/dL (ref 30.0–36.0)
MCV: 85.4 fL (ref 80.0–100.0)
Platelets: 273 10*3/uL (ref 150–400)
RBC: 4.59 MIL/uL (ref 3.87–5.11)
RDW: 13.5 % (ref 11.5–15.5)
WBC: 15.3 10*3/uL — ABNORMAL HIGH (ref 4.0–10.5)
nRBC: 0 % (ref 0.0–0.2)

## 2018-11-14 LAB — CREATININE, SERUM
Creatinine, Ser: 0.85 mg/dL (ref 0.44–1.00)
GFR calc Af Amer: >60 mL/min (ref >60)
GFR calc non Af Amer: >60 mL/min (ref >60)

## 2018-11-14 LAB — POCT PREGNANCY, URINE: Preg Test, Ur: NEGATIVE

## 2018-11-14 SURGERY — CLOSURE, ILEOSTOMY
Anesthesia: General

## 2018-11-14 MED ORDER — SODIUM CHLORIDE 0.9 % IV SOLN
2.0000 g | Freq: Two times a day (BID) | INTRAVENOUS | Status: AC
  Administered 2018-11-15: 2 g via INTRAVENOUS
  Filled 2018-11-14 (×2): qty 2

## 2018-11-14 MED ORDER — ONDANSETRON 4 MG PO TBDP: 4.0000 mg | ORAL_TABLET | Freq: Four times a day (QID) | ORAL | Status: DC | PRN

## 2018-11-14 MED ORDER — FENTANYL CITRATE (PF) 100 MCG/2ML IJ SOLN
INTRAMUSCULAR | Status: AC
  Filled 2018-11-14: qty 2

## 2018-11-14 MED ORDER — BUPIVACAINE-EPINEPHRINE (PF) 0.25% -1:200000 IJ SOLN
INTRAMUSCULAR | Status: DC | PRN
  Administered 2018-11-14: 30 mL

## 2018-11-14 MED ORDER — GABAPENTIN 300 MG PO CAPS
ORAL_CAPSULE | ORAL | Status: AC
  Administered 2018-11-14: 12:00:00 300 mg via ORAL
  Filled 2018-11-14: qty 1

## 2018-11-14 MED ORDER — SUGAMMADEX SODIUM 200 MG/2ML IV SOLN
INTRAVENOUS | Status: DC | PRN
  Administered 2018-11-14: 200 mg via INTRAVENOUS

## 2018-11-14 MED ORDER — GLYCOPYRROLATE PF 0.2 MG/ML IJ SOSY
PREFILLED_SYRINGE | INTRAMUSCULAR | Status: DC | PRN
  Administered 2018-11-14: .2 mg via INTRAVENOUS

## 2018-11-14 MED ORDER — DIPHENHYDRAMINE HCL 50 MG/ML IJ SOLN: 25.0000 mg | Freq: Four times a day (QID) | INTRAMUSCULAR | Status: DC | PRN

## 2018-11-14 MED ORDER — METHOCARBAMOL 500 MG PO TABS
500.0000 mg | ORAL_TABLET | Freq: Four times a day (QID) | ORAL | Status: DC | PRN
  Administered 2018-11-14 – 2018-11-16 (×2): 500 mg via ORAL
  Filled 2018-11-14: qty 1

## 2018-11-14 MED ORDER — GABAPENTIN 300 MG PO CAPS
300.0000 mg | ORAL_CAPSULE | ORAL | Status: AC
  Administered 2018-11-14: 12:00:00 300 mg via ORAL

## 2018-11-14 MED ORDER — FENTANYL CITRATE (PF) 250 MCG/5ML IJ SOLN
INTRAMUSCULAR | Status: AC
  Filled 2018-11-14: qty 5

## 2018-11-14 MED ORDER — EPHEDRINE SULFATE-NACL 50-0.9 MG/10ML-% IV SOSY
PREFILLED_SYRINGE | INTRAVENOUS | Status: DC | PRN
  Administered 2018-11-14: 10 mg via INTRAVENOUS

## 2018-11-14 MED ORDER — PROPOFOL 10 MG/ML IV BOLUS
INTRAVENOUS | Status: DC | PRN
  Administered 2018-11-14: 120 mg via INTRAVENOUS

## 2018-11-14 MED ORDER — DIPHENHYDRAMINE HCL 25 MG PO CAPS: 25.0000 mg | ORAL_CAPSULE | Freq: Four times a day (QID) | ORAL | Status: DC | PRN

## 2018-11-14 MED ORDER — ONDANSETRON HCL 4 MG/2ML IJ SOLN
INTRAMUSCULAR | Status: DC | PRN
  Administered 2018-11-14: 4 mg via INTRAVENOUS

## 2018-11-14 MED ORDER — MIDAZOLAM HCL 2 MG/2ML IJ SOLN
INTRAMUSCULAR | Status: AC
  Filled 2018-11-14: qty 2

## 2018-11-14 MED ORDER — ACETAMINOPHEN 500 MG PO TABS
1000.0000 mg | ORAL_TABLET | ORAL | Status: AC
  Administered 2018-11-14: 12:00:00 1000 mg via ORAL

## 2018-11-14 MED ORDER — ONDANSETRON HCL 4 MG/2ML IJ SOLN
INTRAMUSCULAR | Status: AC
  Filled 2018-11-14: qty 2

## 2018-11-14 MED ORDER — LACTATED RINGERS IV SOLN
INTRAVENOUS | Status: DC
  Administered 2018-11-14 (×2): via INTRAVENOUS

## 2018-11-14 MED ORDER — METHOCARBAMOL 500 MG PO TABS
ORAL_TABLET | ORAL | Status: AC
  Filled 2018-11-14: qty 1

## 2018-11-14 MED ORDER — TRAMADOL HCL 50 MG PO TABS
50.0000 mg | ORAL_TABLET | Freq: Four times a day (QID) | ORAL | Status: DC | PRN
  Administered 2018-11-15 (×2): 50 mg via ORAL
  Filled 2018-11-14 (×2): qty 1

## 2018-11-14 MED ORDER — SODIUM CHLORIDE 0.9 % IV SOLN
INTRAVENOUS | Status: AC
  Filled 2018-11-14: qty 2

## 2018-11-14 MED ORDER — ROCURONIUM BROMIDE 10 MG/ML (PF) SYRINGE
PREFILLED_SYRINGE | INTRAVENOUS | Status: AC
  Filled 2018-11-14: qty 10

## 2018-11-14 MED ORDER — ONDANSETRON HCL 4 MG/2ML IJ SOLN: 4.0000 mg | Freq: Four times a day (QID) | INTRAMUSCULAR | Status: DC | PRN

## 2018-11-14 MED ORDER — ONDANSETRON HCL 4 MG/2ML IJ SOLN
4.0000 mg | Freq: Once | INTRAMUSCULAR | Status: AC | PRN
  Administered 2018-11-14: 15:00:00 4 mg via INTRAVENOUS

## 2018-11-14 MED ORDER — CHLORHEXIDINE GLUCONATE CLOTH 2 % EX PADS: 6.0000 | MEDICATED_PAD | Freq: Once | CUTANEOUS | Status: DC

## 2018-11-14 MED ORDER — ACETAMINOPHEN 500 MG PO TABS
1000.0000 mg | ORAL_TABLET | Freq: Four times a day (QID) | ORAL | Status: DC
  Administered 2018-11-14 – 2018-11-16 (×8): 1000 mg via ORAL
  Filled 2018-11-14 (×8): qty 2

## 2018-11-14 MED ORDER — LIDOCAINE 2% (20 MG/ML) 5 ML SYRINGE
INTRAMUSCULAR | Status: DC | PRN
  Administered 2018-11-14: 50 mg via INTRAVENOUS

## 2018-11-14 MED ORDER — FENTANYL CITRATE (PF) 100 MCG/2ML IJ SOLN
25.0000 ug | INTRAMUSCULAR | Status: DC | PRN
  Administered 2018-11-14 (×3): 50 ug via INTRAVENOUS

## 2018-11-14 MED ORDER — SODIUM CHLORIDE 0.9 % IV SOLN
INTRAVENOUS | Status: DC
  Administered 2018-11-14: 18:00:00 via INTRAVENOUS

## 2018-11-14 MED ORDER — DEXAMETHASONE SODIUM PHOSPHATE 10 MG/ML IJ SOLN
INTRAMUSCULAR | Status: AC
  Filled 2018-11-14: qty 1

## 2018-11-14 MED ORDER — MORPHINE SULFATE (PF) 2 MG/ML IV SOLN
2.0000 mg | INTRAVENOUS | Status: DC | PRN
  Administered 2018-11-14: 18:00:00 2 mg via INTRAVENOUS

## 2018-11-14 MED ORDER — GABAPENTIN 300 MG PO CAPS
300.0000 mg | ORAL_CAPSULE | Freq: Two times a day (BID) | ORAL | Status: DC
  Administered 2018-11-14 – 2018-11-16 (×4): 300 mg via ORAL
  Filled 2018-11-14 (×4): qty 1

## 2018-11-14 MED ORDER — ROCURONIUM BROMIDE 10 MG/ML (PF) SYRINGE
PREFILLED_SYRINGE | INTRAVENOUS | Status: DC | PRN
  Administered 2018-11-14: 50 mg via INTRAVENOUS

## 2018-11-14 MED ORDER — FENTANYL CITRATE (PF) 250 MCG/5ML IJ SOLN
INTRAMUSCULAR | Status: DC | PRN
  Administered 2018-11-14: 50 ug via INTRAVENOUS
  Administered 2018-11-14: 100 ug via INTRAVENOUS

## 2018-11-14 MED ORDER — MIDAZOLAM HCL 2 MG/2ML IJ SOLN
INTRAMUSCULAR | Status: DC | PRN
  Administered 2018-11-14: 2 mg via INTRAVENOUS

## 2018-11-14 MED ORDER — OXYCODONE HCL 5 MG PO TABS
5.0000 mg | ORAL_TABLET | ORAL | Status: DC | PRN
  Administered 2018-11-14 – 2018-11-16 (×3): 5 mg via ORAL
  Filled 2018-11-14 (×2): qty 1

## 2018-11-14 MED ORDER — LIDOCAINE 2% (20 MG/ML) 5 ML SYRINGE
INTRAMUSCULAR | Status: AC
  Filled 2018-11-14: qty 5

## 2018-11-14 MED ORDER — MORPHINE SULFATE (PF) 2 MG/ML IV SOLN
INTRAVENOUS | Status: AC
  Filled 2018-11-14: qty 1

## 2018-11-14 MED ORDER — ACETAMINOPHEN 500 MG PO TABS
ORAL_TABLET | ORAL | Status: AC
  Administered 2018-11-14: 1000 mg via ORAL
  Filled 2018-11-14: qty 2

## 2018-11-14 MED ORDER — ENOXAPARIN SODIUM 40 MG/0.4ML ~~LOC~~ SOLN
40.0000 mg | SUBCUTANEOUS | Status: DC
  Administered 2018-11-15 – 2018-11-16 (×2): 40 mg via SUBCUTANEOUS
  Filled 2018-11-14 (×2): qty 0.4

## 2018-11-14 MED ORDER — DEXAMETHASONE SODIUM PHOSPHATE 10 MG/ML IJ SOLN
INTRAMUSCULAR | Status: DC | PRN
  Administered 2018-11-14: 10 mg via INTRAVENOUS

## 2018-11-14 MED ORDER — SCOPOLAMINE 1 MG/3DAYS TD PT72
MEDICATED_PATCH | TRANSDERMAL | Status: AC
  Filled 2018-11-14: qty 1

## 2018-11-14 MED ORDER — PROPOFOL 10 MG/ML IV BOLUS
INTRAVENOUS | Status: AC
  Filled 2018-11-14: qty 20

## 2018-11-14 MED ORDER — SODIUM CHLORIDE 0.9 % IV SOLN
2.0000 g | INTRAVENOUS | Status: AC
  Administered 2018-11-14: 14:00:00 2 g via INTRAVENOUS

## 2018-11-14 MED ORDER — BUPIVACAINE-EPINEPHRINE (PF) 0.25% -1:200000 IJ SOLN
INTRAMUSCULAR | Status: AC
  Filled 2018-11-14: qty 30

## 2018-11-14 MED ORDER — OXYCODONE HCL 5 MG PO TABS
ORAL_TABLET | ORAL | Status: AC
  Filled 2018-11-14: qty 1

## 2018-11-14 SURGICAL SUPPLY — 43 items
APL PRP STRL LF DISP 70% ISPRP (MISCELLANEOUS) ×1
BLADE CLIPPER SURG (BLADE) IMPLANT
CANISTER SUCT 3000ML PPV (MISCELLANEOUS) ×3 IMPLANT
CHLORAPREP W/TINT 26 (MISCELLANEOUS) ×3 IMPLANT
DRAPE LAPAROSCOPIC ABDOMINAL (DRAPES) ×3 IMPLANT
DRAPE UTILITY XL STRL (DRAPES) ×6 IMPLANT
DRAPE WARM FLUID 44X44 (DRAPES) ×3 IMPLANT
DRSG OPSITE POSTOP 4X6 (GAUZE/BANDAGES/DRESSINGS) ×2 IMPLANT
ELECT BLADE 6.5 EXT (BLADE) IMPLANT
ELECT CAUTERY BLADE 6.4 (BLADE) ×3 IMPLANT
ELECT REM PT RETURN 9FT ADLT (ELECTROSURGICAL) ×3
ELECTRODE REM PT RTRN 9FT ADLT (ELECTROSURGICAL) ×1 IMPLANT
GLOVE BIO SURGEON STRL SZ7 (GLOVE) ×3 IMPLANT
GLOVE BIOGEL PI IND STRL 7.5 (GLOVE) ×1 IMPLANT
GLOVE BIOGEL PI INDICATOR 7.5 (GLOVE) ×2
GOWN STRL REUS W/ TWL LRG LVL3 (GOWN DISPOSABLE) ×3 IMPLANT
GOWN STRL REUS W/TWL LRG LVL3 (GOWN DISPOSABLE) ×12
HANDLE SUCTION POOLE (INSTRUMENTS) ×1 IMPLANT
KIT BASIN OR (CUSTOM PROCEDURE TRAY) ×3 IMPLANT
KIT TURNOVER KIT B (KITS) ×3 IMPLANT
LIGASURE IMPACT 36 18CM CVD LR (INSTRUMENTS) IMPLANT
NDL HYPO 25GX1X1/2 BEV (NEEDLE) IMPLANT
NEEDLE HYPO 25GX1X1/2 BEV (NEEDLE) ×3 IMPLANT
NS IRRIG 1000ML POUR BTL (IV SOLUTION) ×6 IMPLANT
PACK GENERAL/GYN (CUSTOM PROCEDURE TRAY) ×3 IMPLANT
PAD ARMBOARD 7.5X6 YLW CONV (MISCELLANEOUS) ×3 IMPLANT
PENCIL SMOKE EVACUATOR (MISCELLANEOUS) ×3 IMPLANT
RELOAD PROXIMATE 75MM BLUE (ENDOMECHANICALS) ×9 IMPLANT
RELOAD STAPLE 75 3.8 BLU REG (ENDOMECHANICALS) IMPLANT
SPONGE LAP 18X18 RF (DISPOSABLE) ×2 IMPLANT
STAPLER GUN LINEAR PROX 60 (STAPLE) ×2 IMPLANT
STAPLER PROXIMATE 75MM BLUE (STAPLE) ×2 IMPLANT
STAPLER VISISTAT 35W (STAPLE) ×3 IMPLANT
SUCTION POOLE HANDLE (INSTRUMENTS) ×3
SUT PDS AB 1 CTX 36 (SUTURE) ×6 IMPLANT
SUT SILK 2 0 SH CR/8 (SUTURE) ×3 IMPLANT
SUT SILK 2 0 TIES 10X30 (SUTURE) ×3 IMPLANT
SUT SILK 3 0 SH CR/8 (SUTURE) ×3 IMPLANT
SUT SILK 3 0 TIES 10X30 (SUTURE) ×3 IMPLANT
SYR CONTROL 10ML LL (SYRINGE) ×2 IMPLANT
TOWEL GREEN STERILE (TOWEL DISPOSABLE) ×6 IMPLANT
TRAY FOLEY MTR SLVR 16FR STAT (SET/KITS/TRAYS/PACK) ×1 IMPLANT
YANKAUER SUCT BULB TIP NO VENT (SUCTIONS) ×3 IMPLANT

## 2018-11-14 NOTE — Anesthesia Preprocedure Evaluation (Signed)
Anesthesia Evaluation  Patient identified by MRN, date of birth, ID band  Reviewed: Allergy & Precautions, NPO status , Patient's Chart, lab work & pertinent test results  Airway Mallampati: II  TM Distance: >3 FB Neck ROM: Full    Dental  (+) Teeth Intact, Dental Advisory Given   Pulmonary    breath sounds clear to auscultation       Cardiovascular hypertension,  Rhythm:Regular Rate:Normal     Neuro/Psych    GI/Hepatic   Endo/Other    Renal/GU      Musculoskeletal   Abdominal   Peds  Hematology   Anesthesia Other Findings   Reproductive/Obstetrics                             Anesthesia Physical Anesthesia Plan  ASA: II  Anesthesia Plan: General   Post-op Pain Management:    Induction:   PONV Risk Score and Plan: Ondansetron and Dexamethasone  Airway Management Planned: Oral ETT  Additional Equipment:   Intra-op Plan:   Post-operative Plan: Extubation in OR  Informed Consent: I have reviewed the patients History and Physical, chart, labs and discussed the procedure including the risks, benefits and alternatives for the proposed anesthesia with the patient or authorized representative who has indicated his/her understanding and acceptance.     Dental advisory given  Plan Discussed with: CRNA and Anesthesiologist  Anesthesia Plan Comments:         Anesthesia Quick Evaluation

## 2018-11-14 NOTE — Op Note (Signed)
Preop diagnosis: Loop ileostomy after perforated sigmoid colon Postop diagnosis: Same Procedure performed: Reversal of loop ileostomy Surgeon:Inette Doubrava K Mardel Grudzien Anesthesia: General Indications: This is a 48 year old female who had perforated sigmoid diverticulitis.  On August 21, 2018 she underwent sigmoid resection with primary anastomosis and diverting loop ileostomy as well as incidental appendectomy.  She has recovered nicely and recent CT scan shows no residual abscess or leak from her anastomosis.  She presents now for ileostomy reversal.  Description of procedure: The patient is brought to the operating room placed in the supine position on the operating table.  After adequate anesthesia was obtained her ostomy appliance was removed.  Her abdomen was prepped with ChloraPrep and Betadine and draped in sterile fashion.  A timeout was taken to ensure the proper patient and proper procedure.  We made an elliptical incision around her loop ileostomy.  We dissected down into the subcutaneous tissues with cautery.  We dissected completely the 2 limbs of the loop ileostomy.  We entered the peritoneal cavity.  The small bowel was easily mobilized up into the wound.  There are no significant adhesions around the ileostomy site.  The 2 limbs of the loop ileostomy were then resected with a GIA-75 stapler.  We created a side-to-side anastomosis with a GIA-75 stapler and a TA 60 stapler.  The mesenteric defect was closed with 2-0 silk.  The staple line was oversewn with 3 oh silks.  The anastomosis was palpated and was widely patent.  There is no sign of leak or bleeding.  The anastomosis was placed back in the peritoneal cavity.  The fascia was reapproximated with #1 PDS suture.  Subcutaneous tissues were irrigated.  We infiltrated 0.25% Marcaine with epinephrine into the fascia and subcutaneous tissues.  The skin is loosely closed with staples.  A dry dressing was applied.  The patient was then extubated and brought to  the recovery room in stable condition.  All sponge, instrument, and needle counts are correct.  Linda Huff. Georgette Dover, MD, [place] Trauma Surgery Beeper [phone]  11/14/2018 2:43 PM

## 2018-11-14 NOTE — Anesthesia Procedure Notes (Signed)
Procedure Name: Intubation Date/Time: 11/14/2018 1:59 PM Performed by: Teressa Lower., CRNA Pre-anesthesia Checklist: Patient identified, Emergency Drugs available, Suction available and Patient being monitored Patient Re-evaluated:Patient Re-evaluated prior to induction Oxygen Delivery Method: Circle system utilized Preoxygenation: Pre-oxygenation with 100% oxygen Induction Type: IV induction and Cricoid Pressure applied Ventilation: Mask ventilation without difficulty Laryngoscope Size: Mac and 3 Grade View: Grade I Tube type: Oral Tube size: 7.0 mm Number of attempts: 1 Airway Equipment and Method: Stylet Placement Confirmation: ETT inserted through vocal cords under direct vision,  positive ETCO2 and breath sounds checked- equal and bilateral Secured at: 21 cm Tube secured with: Tape Dental Injury: Teeth and Oropharynx as per pre-operative assessment

## 2018-11-14 NOTE — Progress Notes (Signed)
Received Pt from PACU, AOx4, VSS, reports pain of 7/10 will give PRN meds for pain. Oriented to room, call bell, telephone, use of bed controls. Will continue to monitor.

## 2018-11-14 NOTE — Transfer of Care (Signed)
Immediate Anesthesia Transfer of Care Note  Patient: Linda Huff  Procedure(s) Performed: LOOP ILEOSTOMY REVERSAL (N/A )  Patient Location: PACU  Anesthesia Type:General  Level of Consciousness: awake, alert  and oriented  Airway & Oxygen Therapy: Patient Spontanous Breathing and Patient connected to face mask oxygen  Post-op Assessment: Report given to RN and Post -op Vital signs reviewed and stable  Post vital signs: Reviewed and stable  Last Vitals:  Vitals Value Taken Time  BP 130/73 11/14/18 1446  Temp    Pulse 89 11/14/18 1447  Resp 17 11/14/18 1447  SpO2 100 % 11/14/18 1447  Vitals shown include unvalidated device data.  Last Pain:  Vitals:   11/14/18 1140  PainSc: 0-No pain      Patients Stated Pain Goal: 3 (17/49/44 9675)  Complications: No apparent anesthesia complications

## 2018-11-14 NOTE — H&P (Signed)
History of Present Illness   This is a 48 year old female in excellent health who presents after presenting to the emergency department on 08/21/18 with several days of worsening lower abdominal pain. She had obvious peritonitis on examination. CT scan revealed probable perforated sigmoid diverticulitis with general peritonitis and multiple intra-loop abscesses. She underwent exploratory laparotomy, sigmoid colectomy with primary anastomosis, diverting loop ileostomy, incidental appendectomy. The patient was discharged home on 08/27/18. The patient still has limited energy, but this is improving. She has been walking for exercise. Her appetite has returned normal. She is using Ensure drinks as well as a high protein diet. She is no longer using any pain medication. When she eats certain foods (lasagna) she develops fairly high output from her ileostomy. Occasionally she feels light-headed when she gets up from a chair. Her wound has healed completely.  Over the last couple days, she has noticed some intermittent left lower quadrant abdominal pain. No swelling. No change in the volume of her ileostomy output. She has noticed that the output seems a little bit thicker. She has not noticed any gross blood.   Problem List/Past Medical  PERFORATION OF SIGMOID COLON DUE TO DIVERTICULITIS (K57.20)  S/P EXPLORATORY LAPAROTOMY (W09.811)   Past Surgical History  Cataract Surgery  Right.  Colon Removal - Partial   Diagnostic Studies History  Colonoscopy  5-10 years ago Mammogram  within last year Pap Smear  1-5 years ago  Allergies  No Known Drug Allergies   Allergies Reconciled   Medication History  Saline (0.9% Solution, 5-10 two times daily, Taken starting 08/28/2018) Active. Tylenol (500MG  Capsule, Oral) Active. Lomotil (2.5-0.025MG  Tablet, Oral) Active. Ondansetron (4MG  Tablet Disint, Oral) Active. Medications Reconciled  Social History Alcohol use   Moderate alcohol use. Caffeine use  Carbonated beverages, Coffee, Tea. Tobacco use  Never smoker.  Family History  Alcohol Abuse  Father. Breast Cancer  Family Members In General. Colon Cancer  Family Members In General. Hypertension  Mother. Melanoma  Father.  Pregnancy / Birth History  Age at menarche  50 years. Contraceptive History  Intrauterine device, Oral contraceptives. Gravida  3 Length (months) of breastfeeding  3-6 Maternal age  54-30 Para  3  Other Problems Diverticulosis   Vitals  Weight: 133.2 lb Height: 64in Body Surface Area: 1.65 m Body Mass Index: 22.86 kg/m  Temp.: 98.63F (Temporal)  Pulse: 70 (Regular)  P.OX: 99% (Room air) BP: 112/70(Sitting, Left Arm, Standard)       Physical Exam The physical exam findings are as follows: Note: WDWN in NAD Eyes: Pupils equal, round; sclera anicteric HENT: Oral mucosa moist; good dentition Neck: No masses palpated, no thyromegaly Lungs: CTA bilaterally; normal respiratory effort CV: Regular rate and rhythm; no murmurs; extremities well-perfused with no edema Abd: +bowel sounds, soft, incision healed RLQ ileostomy - pink with good output Minimal LLQ tenderness; no palpable masses Skin: Warm, dry; no sign of jaundice Psychiatric - alert and oriented x 4; calm mood and affect  Ct Abdomen Pelvis W Contrast  Result Date: 10/19/2018 CLINICAL DATA:  History of diverticulitis with ruptured colon and multiple intra-abdominal abscesses. Preoperative examination for reversal of ileostomy. Post appendectomy. EXAM: CT ABDOMEN AND PELVIS WITH CONTRAST TECHNIQUE: Multidetector CT imaging of the abdomen and pelvis was performed using the standard protocol following bolus administration of intravenous contrast. Additionally, enteric contrast was administered rectally. CONTRAST:  114mL ISOVUE-300 IOPAMIDOL (ISOVUE-300) INJECTION 61% COMPARISON:  CT abdomen pelvis-08/21/2018 FINDINGS: Lower  chest: Limited visualization of the lower thorax is negative for focal  airspace opacity or pleural effusion. Normal heart size.  No pericardial effusion. Hepatobiliary: Normal hepatic contour. No discrete hepatic lesions. Normal appearance of the gallbladder given underdistention. No radiopaque gallstones. No intra extrahepatic biliary duct dilatation. No ascites. Pancreas: Normal appearance of the pancreas. Spleen: Normal appearance of the spleen. Adrenals/Urinary Tract: There is symmetric enhancement and excretion of the bilateral kidneys. No definite renal stones on this postcontrast examination. No discrete renal lesions. No urine obstruction or perinephric stranding. Normal appearance the bilateral adrenal glands. Normal appearance of the urinary bladder given underdistention. Stomach/Bowel: Rectally administered enteric contrast extends throughout the colon to the level of the loop ileostomy located within the right lower abdomen. No evidence of enteric obstruction. There is no definitive extravasation of administered rectal contrast to suggest the presence of a persistent perforation. No definable/drainable fluid collection within the abdomen or pelvis. Previously identified mesenteric stranding and wall thickening involving multiple loops of large and small bowel within the lower abdomen/pelvis has resolved in the interval without discrete residual area of bowel wall thickening. A few scattered diverticuli are seen involving the descending colon. The appendix is not visualized compatible with provided operative history. Vascular/Lymphatic: Normal caliber the abdominal aorta. The major branch vessels of the abdominal aorta appear patent on this non CTA examination. No bulky retroperitoneal, mesenteric, pelvic or inguinal lymphadenopathy. Reproductive: Normal appearance of the pelvic organs. The uterus is noted to be retroverted. No discrete adnexal lesion. No free fluid within the pelvis with resolution of  previously noted peripherally enhancing collection with the pelvic cul-de-sac. Other: Regional soft tissues appear normal.  No parastomal hernia. Musculoskeletal: No acute or aggressive osseous abnormalities. IMPRESSION: 1. Post loop ileostomy without evidence of enteric obstruction. Rectally administered contrast extends to the level of the loop ileostomy. 2. Near complete resolution of previously noted rather extensive mesenteric stranding and wall thickening involving multiple loops of large and small bowel within the lower abdomen/pelvis. No evidence of perforation or definable/drainable fluid collection. 3. Scattered minimal diverticuli involving the descending colon without evidence of superimposed acute diverticulitis. Electronically Signed   By: Simonne Come M.D.   On: 10/19/2018 10:32     Assessment & Plan  PERFORATION OF SIGMOID COLON DUE TO DIVERTICULITIS (K57.20)   S/P EXPLORATORY LAPAROTOMY (Z98.890)  Current Plans  Schedule for Surgery - Reversal of loop ileostomy. The surgical procedure has been discussed with the patient. Potential risks, benefits, alternative treatments, and expected outcomes have been explained. All of the patient's questions at this time have been answered. The likelihood of reaching the patient's treatment goal is good. The patient understand the proposed surgical procedure and wishes to proceed.  Wilmon Arms. Corliss Skains, MD, [facility]  General/ Trauma Surgery Beeper [phone]  11/14/2018 11:18 AM

## 2018-11-15 ENCOUNTER — Encounter (HOSPITAL_COMMUNITY): Payer: Self-pay | Admitting: General Practice

## 2018-11-15 DIAGNOSIS — Z808 Family history of malignant neoplasm of other organs or systems: Secondary | ICD-10-CM | POA: Diagnosis not present

## 2018-11-15 DIAGNOSIS — Z800 Family history of malignant neoplasm of digestive organs: Secondary | ICD-10-CM | POA: Diagnosis not present

## 2018-11-15 DIAGNOSIS — Z8249 Family history of ischemic heart disease and other diseases of the circulatory system: Secondary | ICD-10-CM | POA: Diagnosis not present

## 2018-11-15 DIAGNOSIS — K5790 Diverticulosis of intestine, part unspecified, without perforation or abscess without bleeding: Secondary | ICD-10-CM | POA: Diagnosis present

## 2018-11-15 DIAGNOSIS — K5720 Diverticulitis of large intestine with perforation and abscess without bleeding: Secondary | ICD-10-CM | POA: Diagnosis present

## 2018-11-15 DIAGNOSIS — Z79899 Other long term (current) drug therapy: Secondary | ICD-10-CM | POA: Diagnosis not present

## 2018-11-15 DIAGNOSIS — Z811 Family history of alcohol abuse and dependence: Secondary | ICD-10-CM | POA: Diagnosis not present

## 2018-11-15 DIAGNOSIS — Z432 Encounter for attention to ileostomy: Secondary | ICD-10-CM | POA: Diagnosis not present

## 2018-11-15 DIAGNOSIS — Z803 Family history of malignant neoplasm of breast: Secondary | ICD-10-CM | POA: Diagnosis not present

## 2018-11-15 DIAGNOSIS — Z8719 Personal history of other diseases of the digestive system: Secondary | ICD-10-CM | POA: Diagnosis not present

## 2018-11-15 LAB — BASIC METABOLIC PANEL
Anion gap: 9 (ref 5–15)
BUN: 6 mg/dL (ref 6–20)
CO2: 23 mmol/L (ref 22–32)
Calcium: 8.4 mg/dL — ABNORMAL LOW (ref 8.9–10.3)
Chloride: 104 mmol/L (ref 98–111)
Creatinine, Ser: 0.79 mg/dL (ref 0.44–1.00)
GFR calc Af Amer: >60 mL/min (ref >60)
GFR calc non Af Amer: >60 mL/min (ref >60)
Glucose, Bld: 126 mg/dL — ABNORMAL HIGH (ref 70–99)
Potassium: 5 mmol/L (ref 3.5–5.1)
Sodium: 136 mmol/L (ref 135–145)

## 2018-11-15 LAB — CBC
HCT: 35.8 % — ABNORMAL LOW (ref 36.0–46.0)
Hemoglobin: 12.6 g/dL (ref 12.0–15.0)
MCH: 30.1 pg (ref 26.0–34.0)
MCHC: 35.2 g/dL (ref 30.0–36.0)
MCV: 85.4 fL (ref 80.0–100.0)
Platelets: 280 10*3/uL (ref 150–400)
RBC: 4.19 MIL/uL (ref 3.87–5.11)
RDW: 13.5 % (ref 11.5–15.5)
WBC: 12.1 10*3/uL — ABNORMAL HIGH (ref 4.0–10.5)
nRBC: 0 % (ref 0.0–0.2)

## 2018-11-15 NOTE — Progress Notes (Signed)
Visited with patient while on rounds. Patient had lots of positive energy about her surgery and her next steps moving forward. Will continue to provide spiritual care as needed.  Rev. Ringsted.

## 2018-11-15 NOTE — Anesthesia Postprocedure Evaluation (Signed)
Anesthesia Post Note  Patient: Linda Huff  Procedure(s) Performed: LOOP ILEOSTOMY REVERSAL (N/A )     Patient location during evaluation: PACU Anesthesia Type: General Level of consciousness: awake and alert Pain management: pain level controlled Vital Signs Assessment: post-procedure vital signs reviewed and stable Respiratory status: spontaneous breathing, nonlabored ventilation, respiratory function stable and patient connected to nasal cannula oxygen Cardiovascular status: blood pressure returned to baseline and stable Postop Assessment: no apparent nausea or vomiting Anesthetic complications: no    Last Vitals:  Vitals:   11/14/18 2348 11/15/18 0439  BP: 108/71 102/64  Pulse: 61 63  Resp: 16 18  Temp: 36.6 C 37.3 C  SpO2: 99% 100%    Last Pain:  Vitals:   11/15/18 0542  TempSrc:   PainSc: 4                  Najia Hurlbutt COKER

## 2018-11-15 NOTE — Progress Notes (Signed)
1 Day Post-Op   Subjective/Chief Complaint: No flatus Sore, but using only tylenol and tramadol for pain. Some cramping in LLQ.   Objective: Vital signs in last 24 hours: Temp:  [96.8 F (36 C)-99.2 F (37.3 C)] 99.2 F (37.3 C) (09/24 0439) Pulse Rate:  [44-81] 63 (09/24 0439) Resp:  [16-22] 18 (09/24 0439) BP: (102-139)/(44-88) 102/64 (09/24 0439) SpO2:  [99 %-100 %] 100 % (09/24 0439) Last BM Date: 11/14/18  Intake/Output from previous day: 09/23 0701 - 09/24 0700 In: 2357.7 [P.O.:270; I.V.:1887.7; IV Piggyback:200.1] Out: 5 [Blood:5] Intake/Output this shift: No intake/output data recorded.  General appearance: alert, cooperative and no distress GI: soft, minimal lower abdominal tenderness,  Incision - minimal drainage; no erythema  Lab Results:  Recent Labs    11/14/18 1907 11/15/18 0403  WBC 15.3* 12.1*  HGB 13.7 12.6  HCT 39.2 35.8*  PLT 273 280   BMET Recent Labs    11/14/18 1907 11/15/18 0403  NA  --  136  K  --  5.0  CL  --  104  CO2  --  23  GLUCOSE  --  126*  BUN  --  6  CREATININE 0.85 0.79  CALCIUM  --  8.4*   PT/INR No results for input(s): LABPROT, INR in the last 72 hours. ABG No results for input(s): PHART, HCO3 in the last 72 hours.  Invalid input(s): PCO2, PO2  Studies/Results: No results found.  Anti-infectives: Anti-infectives (From admission, onward)   Start     Dose/Rate Route Frequency Ordered Stop   11/15/18 0130  cefoTEtan (CEFOTAN) 2 g in sodium chloride 0.9 % 100 mL IVPB     2 g 200 mL/hr over 30 Minutes Intravenous Every 12 hours 11/14/18 1813 11/15/18 0309   11/14/18 1130  cefoTEtan (CEFOTAN) 2 g in sodium chloride 0.9 % 100 mL IVPB     2 g 200 mL/hr over 30 Minutes Intravenous On call to O.R. 11/14/18 1123 11/14/18 1819      Assessment/Plan: s/p Procedure(s): LOOP ILEOSTOMY REVERSAL (N/A) Clear liquids until bowel function  Encourage ambulation   LOS: 1 day    Linda Huff 11/15/2018

## 2018-11-16 LAB — SURGICAL PATHOLOGY

## 2018-11-16 MED ORDER — OXYCODONE HCL 5 MG PO TABS: 5.0000 mg | ORAL_TABLET | Freq: Four times a day (QID) | ORAL | 0 refills | Status: DC | PRN

## 2018-11-16 NOTE — Discharge Instructions (Signed)
[facility], [place] [phone]  OPEN ABDOMINAL SURGERY: POST OP INSTRUCTIONS  Always review your discharge instruction sheet given to you by the facility where your surgery was performed.  IF YOU HAVE DISABILITY OR FAMILY LEAVE FORMS, YOU MUST BRING THEM TO THE OFFICE FOR PROCESSING.  PLEASE DO NOT GIVE THEM TO YOUR DOCTOR.  1. A prescription for pain medication may be given to you upon discharge.  Take your pain medication as prescribed, if needed.  If narcotic pain medicine is not needed, then you may take acetaminophen (Tylenol) or ibuprofen (Advil) as needed. 2. Take your usually prescribed medications unless otherwise directed. 3. If you need a refill on your pain medication, please contact your pharmacy. They will contact our office to request authorization.  Prescriptions will not be filled after 5pm or on week-ends. 4. You should follow a light diet the first few days after arrival home, such as soup and crackers, pudding, etc.unless your doctor has advised otherwise. A high-fiber, low fat diet can be resumed as tolerated.   Be sure to include lots of fluids daily. Most patients will experience some swelling and bruising around the incision.  Ice packs will help.  Swelling and bruising can take several days to resolve 5. Most patients will experience some swelling and bruising in the area of the incision. Ice pack will help. Swelling and bruising can take several days to resolve..  6. It is common to experience some constipation if taking pain medication after surgery.  Increasing fluid intake and taking a stool softener will usually help or prevent this problem from occurring.  A mild laxative (Milk of Magnesia or Miralax) should be taken according to package directions if there are no bowel movements after 48 hours. 7.    Any sutures or staples will be removed at the office during your follow-up visit. You may find that a light gauze bandage over your incision may keep  your staples from being rubbed or pulled. You may shower and replace the bandage daily. 8. ACTIVITIES:  You may resume regular (light) daily activities beginning the next day--such as daily self-care, walking, climbing stairs--gradually increasing activities as tolerated.  You may have sexual intercourse when it is comfortable.  Refrain from any heavy lifting or straining until approved by your doctor. a. You may drive when you no longer are taking prescription pain medication, you can comfortably wear a seatbelt, and you can safely maneuver your car and apply brakes b. Return to Work: ___________________________________ 42. You should see your doctor in the office for a follow-up appointment approximately two weeks after your surgery.  Make sure that you call for this appointment within a day or two after you arrive home to insure a convenient appointment time. OTHER INSTRUCTIONS:  _____________________________________________________________ _____________________________________________________________  WHEN TO CALL YOUR DOCTOR: 1. Fever over 101.0 2. Inability to urinate 3. Nausea and/or vomiting 4. Extreme swelling or bruising 5. Continued bleeding from incision. 6. Increased pain, redness, or drainage from the incision. 7. Difficulty swallowing or breathing 8. Muscle cramping or spasms. 9. Numbness or tingling in hands or feet or around lips.  The clinic staff is available to answer your questions during regular business hours.  Please dont hesitate to call and ask to speak to one of the nurses if you have concerns.  For further questions, please visit [email or web address]

## 2018-11-16 NOTE — Discharge Summary (Signed)
Physician Discharge Summary  Patient ID: Linda Huff MRN: [id number] DOB/AGE: February 27, 1970 48 y.o.  Admit date: 11/14/2018 Discharge date: 11/16/2018  Admission Diagnoses:  Ileostomy in place  Discharge Diagnoses: same Active Problems:   Ileostomy in place [facility])   Discharged Condition: good  Hospital Course: Loop ileostomy reversal 11/14/18.  Bowel function began late 11/15/18.  Diet advanced.  Wound OK.  Pain controlled.  Ready for discharge.   Treatments: surgery: Ileostomy reversal  Discharge Exam: Blood pressure 116/74, pulse (!) 53, temperature 98.2 F (36.8 C), temperature source Oral, resp. rate 16, height 5\' 4"  (1.626 m), last menstrual period 10/22/2018, SpO2 97 %. General appearance: alert, cooperative and no distress GI: soft, minimal distention, mildly tender in RLQ Staple line intact with minimal drainage; no erythema  Disposition: Discharge disposition: 01-Home or Self Care       Discharge Instructions    Call MD for:  persistant nausea and vomiting   Complete by: As directed    Call MD for:  redness, tenderness, or signs of infection (pain, swelling, redness, odor or green/yellow discharge around incision site)   Complete by: As directed    Call MD for:  severe uncontrolled pain   Complete by: As directed    Call MD for:  temperature >100.4   Complete by: As directed    Diet general   Complete by: As directed    Driving Restrictions   Complete by: As directed    Do not drive while taking pain medications   Increase activity slowly   Complete by: As directed    May shower / Bathe   Complete by: As directed      Allergies as of 11/16/2018   No Known Allergies     Medication List    TAKE these medications   oxyCODONE 5 MG immediate release tablet Commonly known as: Oxy IR/ROXICODONE Take 1 tablet (5 mg total) by mouth every 6 (six) hours as needed for moderate pain.   PROBIOTIC DAILY PO Take 1 capsule by mouth daily.      Follow-up  Information    Donnie Mesa, MD. Schedule an appointment as soon as possible for a visit in 10 day(s).   Specialty: General Surgery Contact information: [address] [place] [address] [phone]           Signed: Maia Petties 11/16/2018, 12:27 PM

## 2019-01-09 ENCOUNTER — Ambulatory Visit: Payer: BC Managed Care – PPO | Admitting: Family Medicine

## 2019-03-26 ENCOUNTER — Telehealth: Payer: BC Managed Care – PPO | Admitting: Physician Assistant

## 2019-03-26 DIAGNOSIS — R197 Diarrhea, unspecified: Secondary | ICD-10-CM

## 2019-03-26 DIAGNOSIS — R1030 Lower abdominal pain, unspecified: Secondary | ICD-10-CM

## 2019-03-26 NOTE — Progress Notes (Signed)
Based on what you shared with me, I feel your condition warrants further evaluation and I recommend that you be seen for a face to face office visit.   Due to your history of bowel perforation and abdominal surgeries, and current symptoms of diarrhea with abdominal pain, I feel your symptoms warrant further in-person evaluation. I would recommend seeking care today at your PCP's office or general surgeon's office today. If they are not available, I would recommend seeking care at an Urgent Care or Emergency Department.   NOTE: If you entered your credit card information for this eVisit, you will not be charged. You may see a "hold" on your card for the $35 but that hold will drop off and you will not have a charge processed.   If you are having a true medical emergency please call 911.      For an urgent face to face visit, [facility] has five urgent care centers for your convenience:      NEW:  [facility] at [facility] Directions [phone] [address] [place], [place] [address] . 10 am - 6pm Monday - Friday    [facility]) Get Driving Directions [phone] [address] [place], [place] [address] . 10 am to 8 pm Monday-Friday . 12 pm to 8 pm [facility] Urgent Care at [facility] Get Driving Directions [phone] [address] [place], [place] [address] . 8 am to 8 pm Monday-Friday . 9 am to 6 pm Saturday . 11 am to 6 pm Sunday     [facility] Urgent Care at [facility] Get Driving Directions  [phone] [address] [place], [place] [address] . 8 am to 8 pm Monday-Friday . 8 am to 4 pm [facility] Urgent Care at [facility] Directions [phone] [address] [place], [place] [address] . 12 pm to 6 pm Monday-Friday      Your e-visit answers were reviewed by a board certified  advanced clinical practitioner to complete your personal care plan.  Thank you for using e-Visits.   Greater than 5 minutes, yet less than 10 minutes of time have been spent researching, coordinating, and implementing care for this patient today.

## 2019-04-17 ENCOUNTER — Other Ambulatory Visit: Payer: BC Managed Care – PPO

## 2019-04-23 ENCOUNTER — Ambulatory Visit: Payer: BC Managed Care – PPO | Admitting: Family Medicine

## 2019-04-24 ENCOUNTER — Ambulatory Visit (INDEPENDENT_AMBULATORY_CARE_PROVIDER_SITE_OTHER): Payer: BC Managed Care – PPO | Admitting: Family Medicine

## 2019-04-24 ENCOUNTER — Other Ambulatory Visit: Payer: Self-pay

## 2019-04-24 ENCOUNTER — Encounter: Payer: Self-pay | Admitting: Family Medicine

## 2019-04-24 VITALS — BP 120/76 | HR 54 | Temp 98.3°F | Ht 64.0 in | Wt 144.2 lb

## 2019-04-24 DIAGNOSIS — Z0000 Encounter for general adult medical examination without abnormal findings: Secondary | ICD-10-CM

## 2019-04-24 DIAGNOSIS — Z23 Encounter for immunization: Secondary | ICD-10-CM

## 2019-04-24 DIAGNOSIS — K5790 Diverticulosis of intestine, part unspecified, without perforation or abscess without bleeding: Secondary | ICD-10-CM | POA: Insufficient documentation

## 2019-04-24 DIAGNOSIS — Z8719 Personal history of other diseases of the digestive system: Secondary | ICD-10-CM

## 2019-04-24 LAB — CBC WITH DIFFERENTIAL/PLATELET
Basophils Absolute: 0 10*3/uL (ref 0.0–0.1)
Basophils Relative: 1 % (ref 0.0–3.0)
Eosinophils Absolute: 0.1 10*3/uL (ref 0.0–0.7)
Eosinophils Relative: 2.4 % (ref 0.0–5.0)
HCT: 41.9 % (ref 36.0–46.0)
Hemoglobin: 14.1 g/dL (ref 12.0–15.0)
Lymphocytes Relative: 25 % (ref 12.0–46.0)
Lymphs Abs: 1.1 10*3/uL (ref 0.7–4.0)
MCHC: 33.6 g/dL (ref 30.0–36.0)
MCV: 89.1 fl (ref 78.0–100.0)
Monocytes Absolute: 0.6 10*3/uL (ref 0.1–1.0)
Monocytes Relative: 12.9 % — ABNORMAL HIGH (ref 3.0–12.0)
Neutro Abs: 2.6 10*3/uL (ref 1.4–7.7)
Neutrophils Relative %: 58.7 % (ref 43.0–77.0)
Platelets: 241 10*3/uL (ref 150.0–400.0)
RBC: 4.7 Mil/uL (ref 3.87–5.11)
RDW: 13.3 % (ref 11.5–15.5)
WBC: 4.4 10*3/uL (ref 4.0–10.5)

## 2019-04-24 LAB — COMPREHENSIVE METABOLIC PANEL
ALT: 16 U/L (ref 0–35)
AST: 24 U/L (ref 0–37)
Albumin: 4.4 g/dL (ref 3.5–5.2)
Alkaline Phosphatase: 68 U/L (ref 39–117)
BUN: 14 mg/dL (ref 6–23)
CO2: 28 mEq/L (ref 19–32)
Calcium: 9.9 mg/dL (ref 8.4–10.5)
Chloride: 102 mEq/L (ref 96–112)
Creatinine, Ser: 0.77 mg/dL (ref 0.40–1.20)
GFR: 80.49 mL/min (ref >60.00)
Glucose, Bld: 95 mg/dL (ref 70–99)
Potassium: 5 mEq/L (ref 3.5–5.1)
Sodium: 137 mEq/L (ref 135–145)
Total Bilirubin: 1.1 mg/dL (ref 0.2–1.2)
Total Protein: 7.3 g/dL (ref 6.0–8.3)

## 2019-04-24 LAB — LIPID PANEL
Cholesterol: 158 mg/dL (ref 0–200)
HDL: 80.1 mg/dL (ref >39.00)
LDL Cholesterol: 59 mg/dL (ref 0–99)
NonHDL: 78.25
Total CHOL/HDL Ratio: 2
Triglycerides: 94 mg/dL (ref 0.0–149.0)
VLDL: 18.8 mg/dL (ref 0.0–40.0)

## 2019-04-24 NOTE — Addendum Note (Signed)
Addended byGracy Racer on: 04/24/2019 11:15 AM   Modules accepted: Orders

## 2019-04-24 NOTE — Patient Instructions (Addendum)
Please return in 12 months for your annual complete physical; please come fasting.  I will release your lab results to you on your MyChart account with further instructions. Please reply with any questions.   It was a pleasure meeting you today! Thank you for choosing [place] to meet your healthcare needs! I truly look forward to working with you. If you have any questions or concerns, please send me a message via Mychart or call the office at [phone].   Preventive Care 62-47 Years Old, Female Preventive care refers to visits with your health care provider and lifestyle choices that can promote health and wellness. This includes:  A yearly physical exam. This may also be called an annual well check.  Regular dental visits and eye exams.  Immunizations.  Screening for certain conditions.  Healthy lifestyle choices, such as eating a healthy diet, getting regular exercise, not using drugs or products that contain nicotine and tobacco, and limiting alcohol use. What can I expect for my preventive care visit? Physical exam Your health care provider will check your:  Height and weight. This may be used to calculate body mass index (BMI), which tells if you are at a healthy weight.  Heart rate and blood pressure.  Skin for abnormal spots. Counseling Your health care provider may ask you questions about your:  Alcohol, tobacco, and drug use.  Emotional well-being.  Home and relationship well-being.  Sexual activity.  Eating habits.  Work and work Statistician.  Method of birth control.  Menstrual cycle.  Pregnancy history. What immunizations do I need?  Influenza (flu) vaccine  This is recommended every year. Tetanus, diphtheria, and pertussis (Tdap) vaccine  You may need a Td booster every 10 years. Varicella (chickenpox) vaccine  You may need this if you have not been vaccinated. Zoster (shingles) vaccine  You may need this after age 47. Measles, mumps, and  rubella (MMR) vaccine  You may need at least one dose of MMR if you were born in 1957 or later. You may also need a second dose. Pneumococcal conjugate (PCV13) vaccine  You may need this if you have certain conditions and were not previously vaccinated. Pneumococcal polysaccharide (PPSV23) vaccine  You may need one or two doses if you smoke cigarettes or if you have certain conditions. Meningococcal conjugate (MenACWY) vaccine  You may need this if you have certain conditions. Hepatitis A vaccine  You may need this if you have certain conditions or if you travel or work in places where you may be exposed to hepatitis A. Hepatitis B vaccine  You may need this if you have certain conditions or if you travel or work in places where you may be exposed to hepatitis B. Haemophilus influenzae type b (Hib) vaccine  You may need this if you have certain conditions. Human papillomavirus (HPV) vaccine  If recommended by your health care provider, you may need three doses over 6 months. You may receive vaccines as individual doses or as more than one vaccine together in one shot (combination vaccines). Talk with your health care provider about the risks and benefits of combination vaccines. What tests do I need? Blood tests  Lipid and cholesterol levels. These may be checked every 5 years, or more frequently if you are over 48 years old.  Hepatitis C test.  Hepatitis B test. Screening  Lung cancer screening. You may have this screening every year starting at age 47 if you have a 30-pack-year history of smoking and currently smoke or have  quit within the past 15 years.  Colorectal cancer screening. All adults should have this screening starting at age 47 and continuing until age 19. Your health care provider may recommend screening at age 47 if you are at increased risk. You will have tests every 1-10 years, depending on your results and the type of screening test.  Diabetes screening. This  is done by checking your blood sugar (glucose) after you have not eaten for a while (fasting). You may have this done every 1-3 years.  Mammogram. This may be done every 1-2 years. Talk with your health care provider about when you should start having regular mammograms. This may depend on whether you have a family history of breast cancer.  BRCA-related cancer screening. This may be done if you have a family history of breast, ovarian, tubal, or peritoneal cancers.  Pelvic exam and Pap test. This may be done every 3 years starting at age 47. Starting at age 47, this may be done every 5 years if you have a Pap test in combination with an HPV test. Other tests  Sexually transmitted disease (STD) testing.  Bone density scan. This is done to screen for osteoporosis. You may have this scan if you are at high risk for osteoporosis. Follow these instructions at home: Eating and drinking  Eat a diet that includes fresh fruits and vegetables, whole grains, lean protein, and low-fat dairy.  Take vitamin and mineral supplements as recommended by your health care provider.  Do not drink alcohol if: ? Your health care provider tells you not to drink. ? You are pregnant, may be pregnant, or are planning to become pregnant.  If you drink alcohol: ? Limit how much you have to 0-1 drink a day. ? Be aware of how much alcohol is in your drink. In the [place] one drink equals one 12 oz bottle of beer (355 mL), one 5 oz glass of wine (148 mL), or one 1 oz glass of hard liquor (44 mL). Lifestyle  Take daily care of your teeth and gums.  Stay active. Exercise for at least 30 minutes on 5 or more days each week.  Do not use any products that contain nicotine or tobacco, such as cigarettes, e-cigarettes, and chewing tobacco. If you need help quitting, ask your health care provider.  If you are sexually active, practice safe sex. Use a condom or other form of birth control (contraception) in order to prevent  pregnancy and STIs (sexually transmitted infections).  If told by your health care provider, take low-dose aspirin daily starting at age 47. What's next?  Visit your health care provider once a year for a well check visit.  Ask your health care provider how often you should have your eyes and teeth checked.  Stay up to date on all vaccines. This information is not intended to replace advice given to you by your health care provider. Make sure you discuss any questions you have with your health care provider. Document Revised: 10/19/2017 Document Reviewed: 10/19/2017 Elsevier Patient Education  2020 [employer].   Calcium Intake Recommendations You can take Caltrate Plus twice a day or get it through your diet or other OTC supplements (Viactiv, OsCal etc)  Calcium is a mineral that affects many functions in the body, including:  Blood clotting.  Blood vessel function.  Nerve impulse conduction.  Hormone secretion.  Muscle contraction.  Bone and teeth functions.  Most of your body's calcium supply is stored in your bones and teeth.  When your calcium stores are low, you may be at risk for low bone mass, bone loss, and bone fractures. Consuming enough calcium helps to grow healthy bones and teeth and to prevent breakdown over time. It is very important that you get enough calcium if you are:  A child undergoing rapid growth.  An adolescent girl.  A pre- or post-menopausal woman.  A woman whose menstrual cycle has stopped due to anorexia nervosa or regular intense exercise.  An individual with lactose intolerance or a milk allergy.  A vegetarian.  What is my plan? Try to consume the recommended amount of calcium daily based on your age. Depending on your overall health, your health care provider may recommend increased calcium intake.General daily calcium intake recommendations by age are:  Birth to 6 months: 200 mg.  Infants 7 to 12 months: 260 mg.  Children 1 to  3 years: 700 mg.  Children 4 to 8 years: 1,000 mg.  Children 9 to 13 years: 1,300 mg.  Teens 14 to 18 years: 1,300 mg.  Adults 19 to 50 years: 1,000 mg.  Adult women 51 to 70 years: 1,200 mg.  Adult men 51 to 70 years: 1,000 mg.  Adults 71 years and older: 1,200 mg.  Pregnant and breastfeeding teens: 1,300 mg.  Pregnant and breastfeeding adults: 1,000 mg.  What do I need to know about calcium intake?  In order for the body to absorb calcium, it needs vitamin D. You can get vitamin D through (we recommend getting [id number] units of Vitamin D daily) ? Direct exposure of the skin to sunlight. ? Foods, such as egg yolks, liver, saltwater fish, and fortified milk. ? Supplements.  Consuming too much calcium may cause: ? Constipation. ? Decreased absorption of iron and zinc. ? Kidney stones.  Calcium supplements may interact with certain medicines. Check with your health care provider before starting any calcium supplements.  Try to get most of your calcium from food. What foods can I eat? Grains  Fortified oatmeal. Fortified ready-to-eat cereals. Fortified frozen waffles. Vegetables Turnip greens. Broccoli. Fruits Fortified orange juice. Meats and Other Protein Sources Canned sardines with bones. Canned salmon with bones. Soy beans. Tofu. Baked beans. Almonds. [place] nuts. Sunflower seeds. Dairy Milk. Yogurt. Cheese. Cottage cheese. Beverages Fortified soy milk. Fortified rice milk. Sweets/Desserts Pudding. Ice Cream. Milkshakes. Blackstrap molasses. The items listed above may not be a complete list of recommended foods or beverages. Contact your dietitian for more options. What foods can affect my calcium intake? It may be more difficult for your body to use calcium or calcium may leave your body more quickly if you consume large amounts of:  Sodium.  Protein.  Caffeine.  Alcohol.  This information is not intended to replace advice given to you by your health  care provider. Make sure you discuss any questions you have with your health care provider. Document Released: 09/22/2003 Document Revised: 08/28/2015 Document Reviewed: 07/16/2013 Elsevier Interactive Patient Education  2018 [employer].

## 2019-04-24 NOTE — Progress Notes (Signed)
Subjective  Chief Complaint  Patient presents with  . New Patient (Initial Visit)    Dr Florina Ou was last previous pcp around 2015. No new concerns today    HPI: Linda Huff is a 49 y.o. female who presents to [facility] today for a Female Wellness Visit. She also has the concerns and/or needs as listed above in the chief complaint. These will be addressed in addition to the Health Maintenance Visit.   Wellness Visit: annual visit with health maintenance review and exam without Pap   HM: Dr. Nori Riis for female wellness; last may 2020; pap and mammo reportedly normal at that time. Healthy active lifestyle. Married G3P3 with 1 daughter and 2 sons, oldest in college, youngest 7th grade. Works Warehouse manager. Tennis is her hobby.  Chronic disease f/u and/or acute problem visit: (deemed necessary to be done in addition to the wellness visit):  H/o perforated colon due to diverticulitis: urgent surgery and ileostomy; since closed and resolved. I reviewed records. Doing well.   Assessment  1. Annual physical exam   2. Diverticulosis   3. History of diverticulitis of colon      Plan  Female Wellness Visit:  Age appropriate Health Maintenance and Prevention measures were discussed with patient. Included topics are cancer screening recommendations, ways to keep healthy (see AVS) including dietary and exercise recommendations, regular eye and dental care, use of seat belts, and avoidance of moderate alcohol use and tobacco use.   BMI: discussed patient's BMI and encouraged positive lifestyle modifications to help get to or maintain a target BMI.  HM needs and immunizations were addressed and ordered. See below for orders. See HM and immunization section for updates.  Routine labs and screening tests ordered including cmp, cbc and lipids where appropriate.  Discussed recommendations regarding Vit D and calcium supplementation (see AVS)  Chronic disease  management visit and/or acute problem visit:  Monitor for tics or obstructions.   Follow up: Return in about 1 year (around 04/23/2020) for complete physical.  Orders Placed This Encounter  Procedures  . CBC with Differential/Platelet  . Comprehensive metabolic panel  . Lipid panel  . HIV Antibody (routine testing w rflx)   No orders of the defined types were placed in this encounter.     Lifestyle: Body mass index is 24.75 kg/m. Wt Readings from Last 3 Encounters:  04/24/19 144 lb 3.2 oz (65.4 kg)  11/07/18 130 lb 6.4 oz (59.1 kg)  08/21/18 147 lb (66.7 kg)    Patient Active Problem List   Diagnosis Date Noted  . Diverticulosis 04/24/2019  . History of diverticulitis of colon 04/24/2019    Perforated colon 2020   . Bradycardia 06/17/2014   Health Maintenance  Topic Date Due  . HIV Screening  09/16/1987  . TETANUS/TDAP  02/21/2021  . PAP SMEAR-Modifier  05/11/2023  . INFLUENZA VACCINE  Completed   Immunization History  Administered Date(s) Administered  . Influenza,inj,Quad PF,6+ Mos 12/24/2014, 11/08/2018  . Influenza-Unspecified 11/01/2018  . Tdap 02/22/2011   We updated and reviewed the patient's past history in detail and it is documented below. Allergies: Patient has No Known Allergies. Past Medical History Patient  has a past medical history of Dysrhythmia, Hypertension (2002), Perforation of sigmoid colon due to diverticulitis (08/22/2018), and Status post exploratory laparotomy (08/21/2018). Past Surgical History Patient  has a past surgical history that includes Wisdom tooth extraction; laparotomy (N/A, 08/21/2018); Ileostomy closure (11/14/2018); and Ileostomy closure (N/A, 11/14/2018). Family History: Patient  family history includes Alcohol abuse in her father; Healthy in her daughter, son, and son; Hyperlipidemia in her mother; Lung cancer in her paternal grandfather; Osteoporosis in her maternal grandmother; Stomach cancer in her maternal  grandfather. Social History:  Patient  reports that she has never smoked. She has never used smokeless tobacco. She reports current alcohol use of about 3.0 standard drinks of alcohol per week. She reports that she does not use drugs.  Review of Systems: Constitutional: negative for fever or malaise Ophthalmic: negative for photophobia, double vision or loss of vision Cardiovascular: negative for chest pain, dyspnea on exertion, or new LE swelling Respiratory: negative for SOB or persistent cough Gastrointestinal: negative for abdominal pain, change in bowel habits or melena Genitourinary: negative for dysuria or gross hematuria, no abnormal uterine bleeding or disharge Musculoskeletal: negative for new gait disturbance or muscular weakness Integumentary: negative for new or persistent rashes, no breast lumps Neurological: negative for TIA or stroke symptoms Psychiatric: negative for SI or delusions Allergic/Immunologic: negative for hives  Patient Care Team    Relationship Specialty Notifications Start End  Willow Ora, MD PCP - General Family Medicine  04/24/19   Ginette Otto, [facility] Consulting Physician Obstetrics and Gynecology  04/24/19   Freddy Finner, MD Consulting Physician Obstetrics and Gynecology  04/24/19   Jacqlyn Krauss, MD Consulting Physician Dermatology  04/24/19     Objective  Vitals: BP 120/76 (BP Location: Right Arm, Patient Position: Sitting, Cuff Size: Normal)   Pulse (!) 54   Temp 98.3 F (36.8 C) (Temporal)   Ht 5\' 4"  (1.626 m)   Wt 144 lb 3.2 oz (65.4 kg)   LMP 03/31/2019   SpO2 98%   BMI 24.75 kg/m  General:  Well developed, well nourished, no acute distress  Psych:  Alert and orientedx3,normal mood and affect HEENT:  Normocephalic, atraumatic, non-icteric sclera, PERRL, supple neck without adenopathy, mass or thyromegaly Cardiovascular:  Normal S1, S2, RRR without gallop, rub or murmur,  Respiratory:  Good breath sounds  bilaterally, CTAB with normal respiratory effort Gastrointestinal: normal bowel sounds, soft, non-tender, no noted masses. No HSM MSK: no deformities, contusions. Joints are without erythema or swelling.  Skin:  Warm, no rashes or suspicious lesions noted Neurologic:    Mental status is normal.  Gross motor and sensory exams are normal. Normal gait. No tremor    Commons side effects, risks, benefits, and alternatives for medications and treatment plan prescribed today were discussed, and the patient expressed understanding of the given instructions. Patient is instructed to call or message via MyChart if he/she has any questions or concerns regarding our treatment plan. No barriers to understanding were identified. We discussed Red Flag symptoms and signs in detail. Patient expressed understanding regarding what to do in case of urgent or emergency type symptoms.   Medication list was reconciled, printed and provided to the patient in AVS. Patient instructions and summary information was reviewed with the patient as documented in the AVS. This note was prepared with assistance of Dragon voice recognition software. Occasional wrong-word or sound-a-like substitutions may have occurred due to the inherent limitations of voice recognition software  This visit occurred during the SARS-CoV-2 public health emergency.  Safety protocols were in place, including screening questions prior to the visit, additional usage of staff PPE, and extensive cleaning of exam room while observing appropriate contact time as indicated for disinfecting solutions.

## 2019-04-25 LAB — HIV ANTIBODY (ROUTINE TESTING W REFLEX): HIV 1&2 Ab, 4th Generation: NONREACTIVE

## 2019-05-16 ENCOUNTER — Ambulatory Visit: Payer: BC Managed Care – PPO | Attending: Internal Medicine

## 2019-05-16 ENCOUNTER — Ambulatory Visit: Payer: BC Managed Care – PPO

## 2019-05-16 DIAGNOSIS — Z23 Encounter for immunization: Secondary | ICD-10-CM

## 2019-05-16 NOTE — Progress Notes (Signed)
   Covid-19 Vaccination Clinic  Name:  KASHMIR LEEDY    MRN: [id number] DOB: 10-Apr-1970  05/16/2019  Ms. Franchini was observed post Covid-19 immunization for 15 minutes without incident. She was provided with Vaccine Information Sheet and instruction to access the V-Safe system.   Ms. Rymer was instructed to call 911 with any severe reactions post vaccine: Marland Kitchen Difficulty breathing  . Swelling of face and throat  . A fast heartbeat  . A bad rash all over body  . Dizziness and weakness   Immunizations Administered    Name Date Dose VIS Date Route   Pfizer COVID-19 Vaccine 05/16/2019  1:03 PM 0.3 mL 02/01/2019 Intramuscular   Manufacturer: [employer], [employer]   Lot: [id number]   NDC: [address]-6

## 2019-06-10 ENCOUNTER — Ambulatory Visit: Payer: BC Managed Care – PPO | Attending: Internal Medicine

## 2019-06-10 DIAGNOSIS — Z23 Encounter for immunization: Secondary | ICD-10-CM

## 2019-06-10 NOTE — Progress Notes (Signed)
   Covid-19 Vaccination Clinic  Name:  Linda Huff    MRN: [id number] DOB: 1970/04/17  06/10/2019  [name] was observed post Covid-19 immunization for 15 minutes without incident. She was provided with Vaccine Information Sheet and instruction to access the V-Safe system.   [name] was instructed to call 911 with any severe reactions post vaccine: Marland Kitchen Difficulty breathing  . Swelling of face and throat  . A fast heartbeat  . A bad rash all over body  . Dizziness and weakness   Immunizations Administered    Name Date Dose VIS Date Route   Pfizer COVID-19 Vaccine 06/10/2019 12:41 PM 0.3 mL 04/17/2018 Intramuscular   Manufacturer: [employer], [employer]   Lot: [id number]   NDC: [address]-1

## 2019-09-17 DIAGNOSIS — Z1231 Encounter for screening mammogram for malignant neoplasm of breast: Secondary | ICD-10-CM | POA: Diagnosis not present

## 2019-09-17 DIAGNOSIS — Z6824 Body mass index (BMI) 24.0-24.9, adult: Secondary | ICD-10-CM | POA: Diagnosis not present

## 2019-09-17 DIAGNOSIS — Z01419 Encounter for gynecological examination (general) (routine) without abnormal findings: Secondary | ICD-10-CM | POA: Diagnosis not present

## 2019-09-19 DIAGNOSIS — Z20822 Contact with and (suspected) exposure to covid-19: Secondary | ICD-10-CM | POA: Diagnosis not present

## 2020-01-13 DIAGNOSIS — J0110 Acute frontal sinusitis, unspecified: Secondary | ICD-10-CM | POA: Diagnosis not present

## 2020-06-23 DIAGNOSIS — J309 Allergic rhinitis, unspecified: Secondary | ICD-10-CM | POA: Diagnosis not present

## 2020-06-30 DIAGNOSIS — Z20822 Contact with and (suspected) exposure to covid-19: Secondary | ICD-10-CM | POA: Diagnosis not present

## 2020-08-03 DIAGNOSIS — J309 Allergic rhinitis, unspecified: Secondary | ICD-10-CM | POA: Diagnosis not present

## 2020-09-23 DIAGNOSIS — Z6826 Body mass index (BMI) 26.0-26.9, adult: Secondary | ICD-10-CM | POA: Diagnosis not present

## 2020-09-23 DIAGNOSIS — N951 Menopausal and female climacteric states: Secondary | ICD-10-CM | POA: Diagnosis not present

## 2020-09-23 DIAGNOSIS — Z1231 Encounter for screening mammogram for malignant neoplasm of breast: Secondary | ICD-10-CM | POA: Diagnosis not present

## 2020-09-23 DIAGNOSIS — Z01419 Encounter for gynecological examination (general) (routine) without abnormal findings: Secondary | ICD-10-CM | POA: Diagnosis not present

## 2020-10-18 IMAGING — CT CT ABDOMEN AND PELVIS WITH CONTRAST
1 of 3 series · 12 of 32 positions shown, 17 images · IV contrast (APPLIED)
Comparison: CT abdomen pelvis-08/21/2018

CLINICAL DATA: History of diverticulitis with ruptured colon and
multiple intra-abdominal abscesses. Preoperative examination for
reversal of ileostomy. Post appendectomy.

EXAM:
CT ABDOMEN AND PELVIS WITH CONTRAST
TECHNIQUE: Multidetector CT imaging of the abdomen and pelvis was performed
using the standard protocol following bolus administration of
intravenous contrast. Additionally, enteric contrast was
administered rectally.
CONTRAST:  100mL ZONH7D-3DD IOPAMIDOL (ZONH7D-3DD) INJECTION 61%

[Series 2: abd/pelvis w/cm · axial · 0.73mm/px · z∈[-441,-86]mm · 12 of 83 slices shown, 17 images]
[im 6/83  soft-tissue]
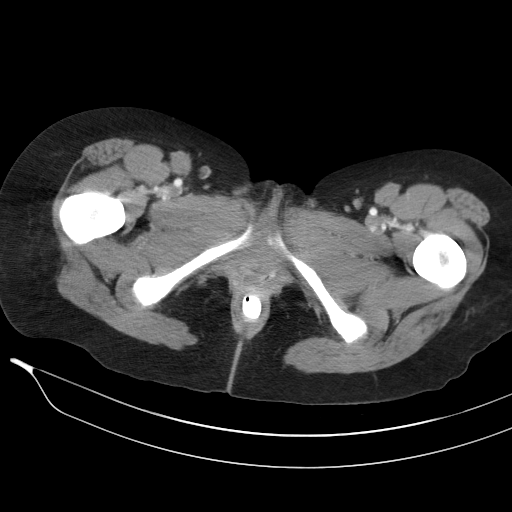
[im 6/83  bone]
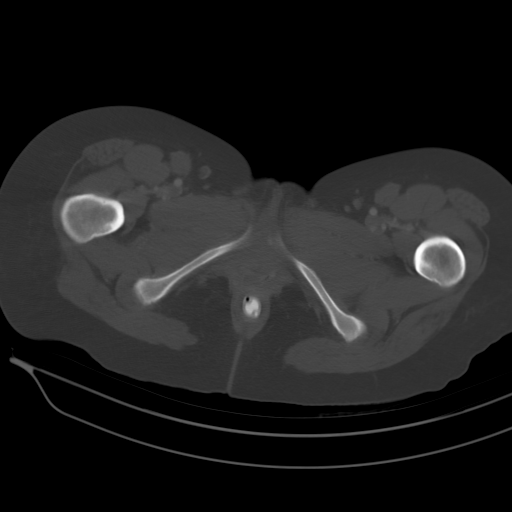
[im 11/83  soft-tissue]
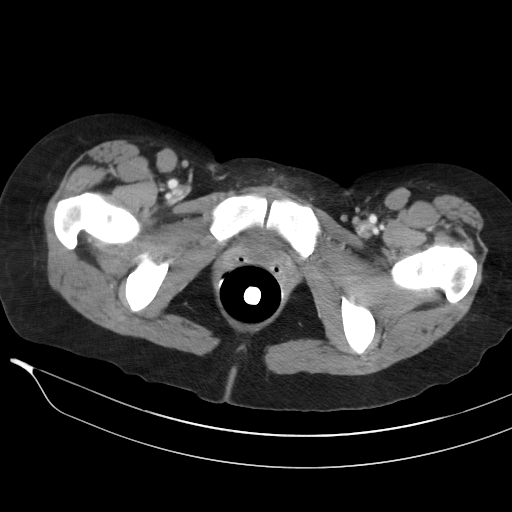
[im 22/83  soft-tissue]
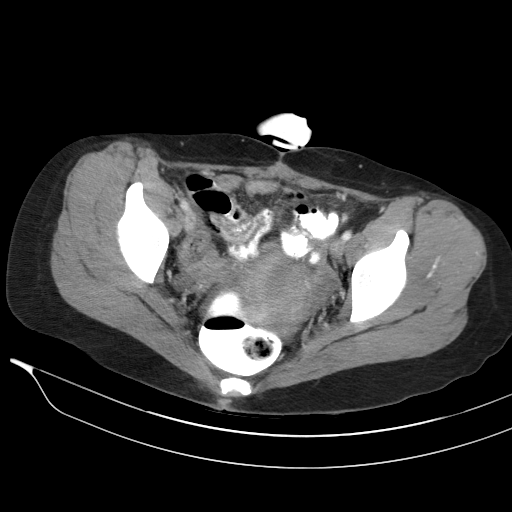
[im 28/83  soft-tissue]
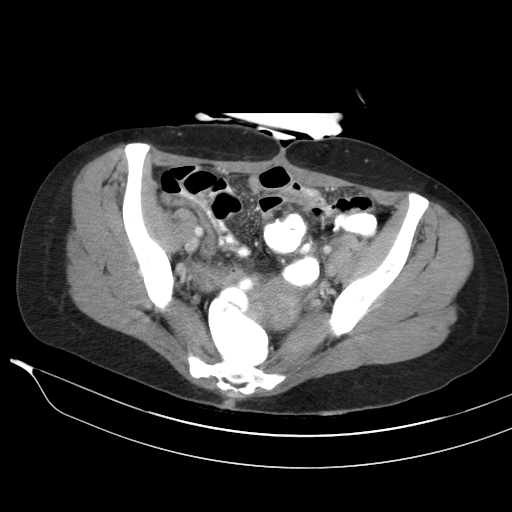
[im 33/83  soft-tissue]
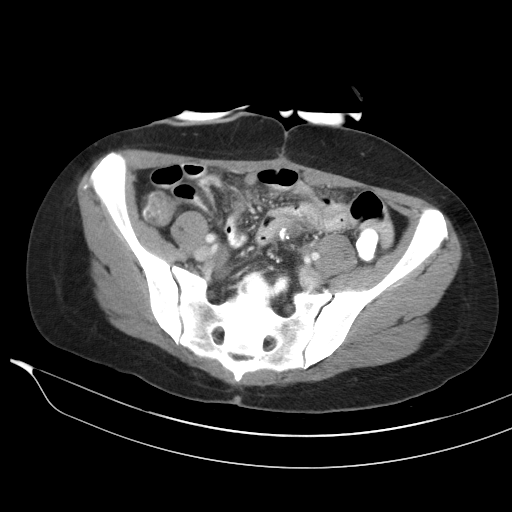
[im 44/83  soft-tissue]
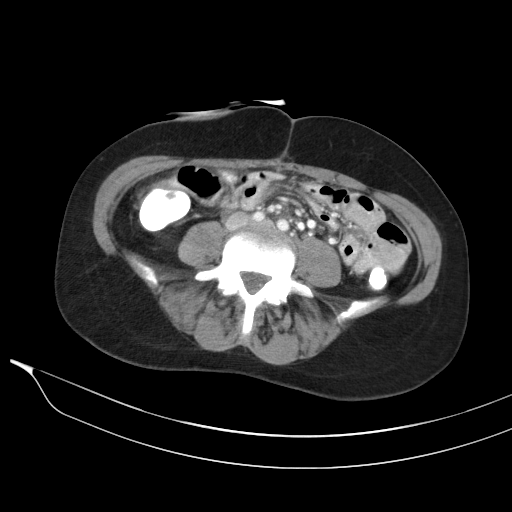
[im 50/83  soft-tissue]
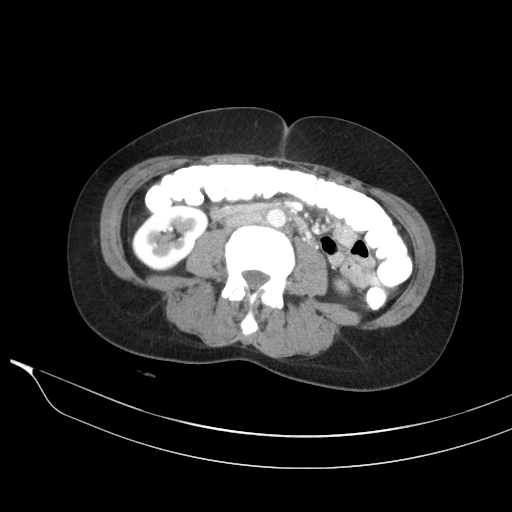
[im 55/83  soft-tissue]
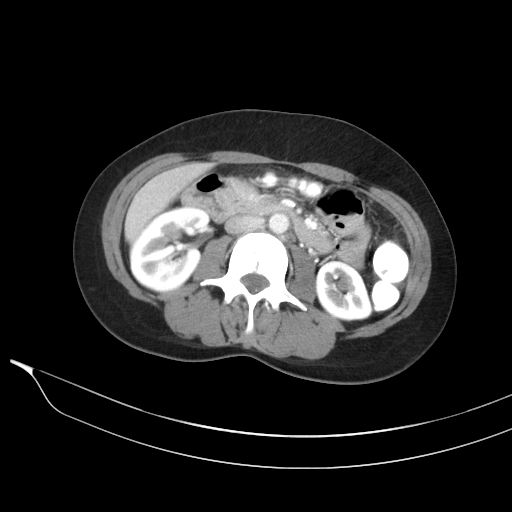
[im 61/83  soft-tissue]
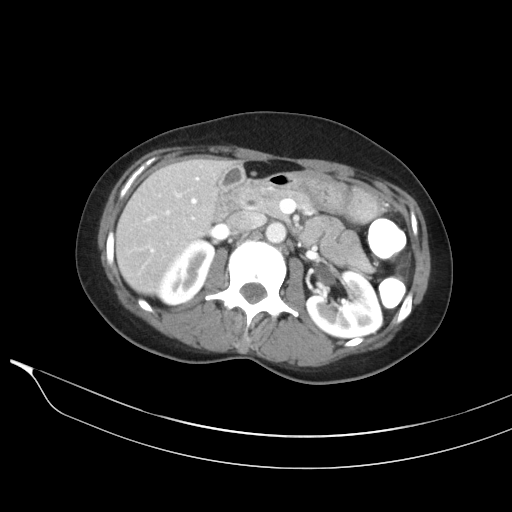
[im 61/83  lung]
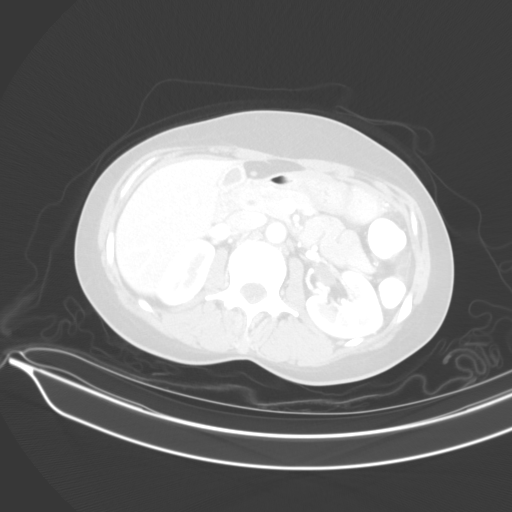
[im 61/83  bone]
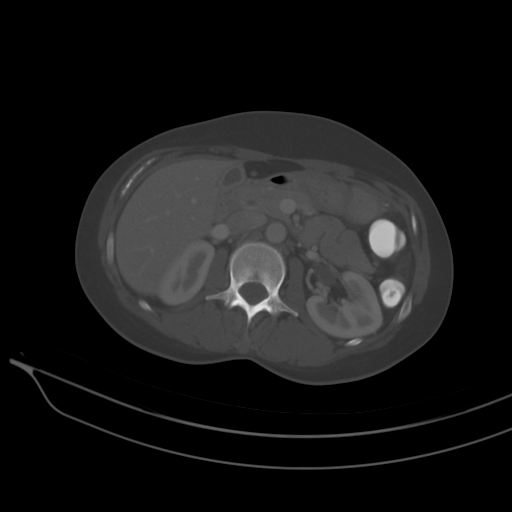
[im 66/83  lung]
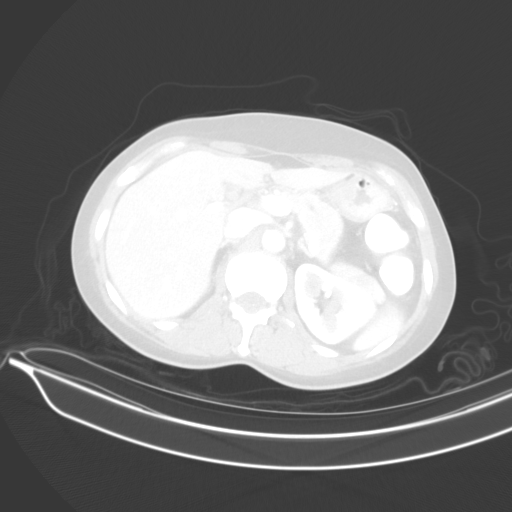
[im 72/83  soft-tissue]
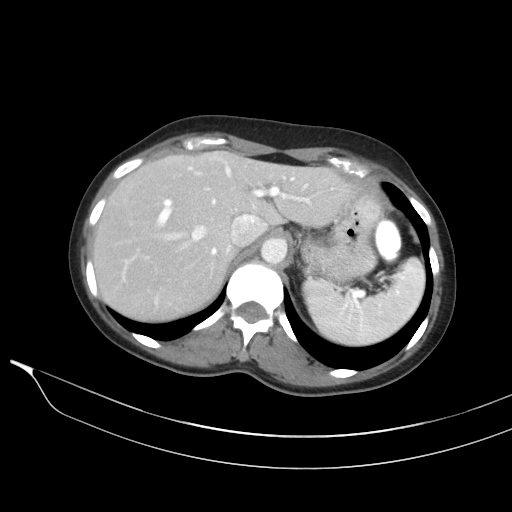
[im 72/83  lung]
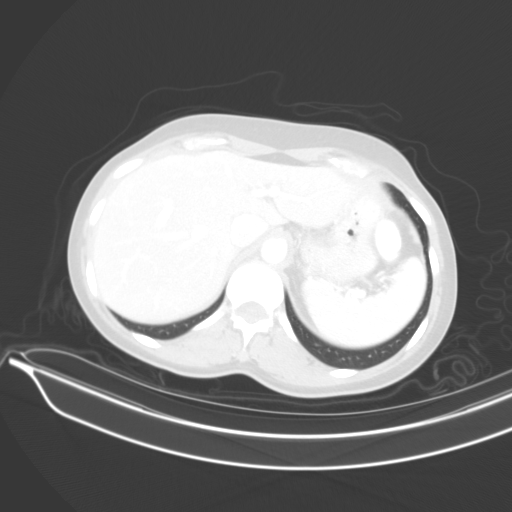
[im 77/83  soft-tissue]
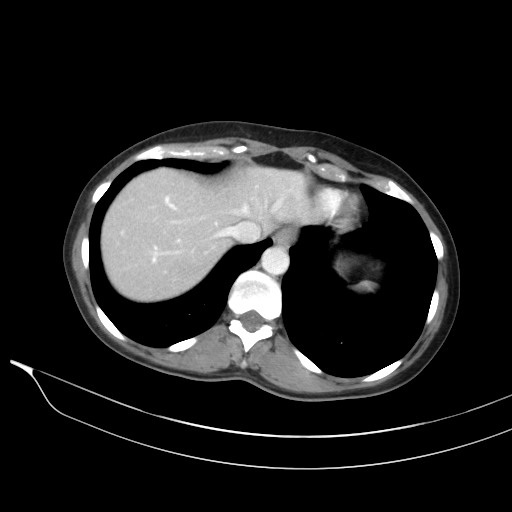
[im 77/83  lung]
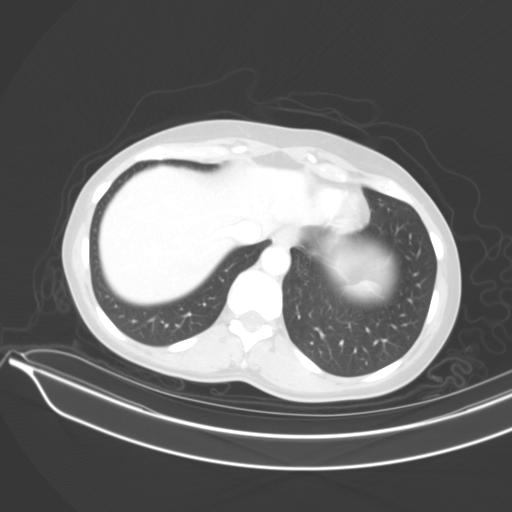

[12 of 32 positions shown; findings below may reference images not displayed]

FINDINGS: Lower chest: Limited visualization of the lower thorax is negative
for focal airspace opacity or pleural effusion.

Normal heart size.  No pericardial effusion.

Hepatobiliary: Normal hepatic contour. No discrete hepatic lesions.
Normal appearance of the gallbladder given underdistention. No
radiopaque gallstones. No intra extrahepatic biliary duct
dilatation. No ascites.

Pancreas: Normal appearance of the pancreas.

Spleen: Normal appearance of the spleen.

Adrenals/Urinary Tract: There is symmetric enhancement and excretion
of the bilateral kidneys. No definite renal stones on this
postcontrast examination. No discrete renal lesions. No urine
obstruction or perinephric stranding.

Normal appearance the bilateral adrenal glands.

Normal appearance of the urinary bladder given underdistention.

Stomach/Bowel: Rectally administered enteric contrast extends
throughout the colon to the level of the loop ileostomy located
within the right lower abdomen. No evidence of enteric obstruction.
There is no definitive extravasation of administered rectal contrast
to suggest the presence of a persistent perforation. No
definable/drainable fluid collection within the abdomen or pelvis.

Previously identified mesenteric stranding and wall thickening
involving multiple loops of large and small bowel within the lower
abdomen/pelvis has resolved in the interval without discrete
residual area of bowel wall thickening. A few scattered diverticuli
are seen involving the descending colon. The appendix is not
visualized compatible with provided operative history.

Vascular/Lymphatic: Normal caliber the abdominal aorta. The major
branch vessels of the abdominal aorta appear patent on this non CTA
examination.

No bulky retroperitoneal, mesenteric, pelvic or inguinal
lymphadenopathy.

Reproductive: Normal appearance of the pelvic organs. The uterus is
noted to be retroverted. No discrete adnexal lesion. No free fluid
within the pelvis with resolution of previously noted peripherally
enhancing collection with the pelvic cul-de-sac.

Other: Regional soft tissues appear normal.  No parastomal hernia.

Musculoskeletal: No acute or aggressive osseous abnormalities.
IMPRESSION: 1. Post loop ileostomy without evidence of enteric obstruction.
Rectally administered contrast extends to the level of the loop
ileostomy.
2. Near complete resolution of previously noted rather extensive
mesenteric stranding and wall thickening involving multiple loops of
large and small bowel within the lower abdomen/pelvis. No evidence
of perforation or definable/drainable fluid collection.
3. Scattered minimal diverticuli involving the descending colon
without evidence of superimposed acute diverticulitis.

## 2020-11-05 ENCOUNTER — Other Ambulatory Visit: Payer: Self-pay

## 2020-11-05 ENCOUNTER — Ambulatory Visit
Admission: EM | Admit: 2020-11-05 | Discharge: 2020-11-05 | Disposition: A | Discharge status: Home / Self Care | Payer: BC Managed Care – PPO | Attending: Emergency Medicine | Admitting: Emergency Medicine

## 2020-11-05 DIAGNOSIS — S86112A Strain of other muscle(s) and tendon(s) of posterior muscle group at lower leg level, left leg, initial encounter: Secondary | ICD-10-CM

## 2020-11-05 MED ORDER — IBUPROFEN 800 MG PO TABS: 800.0000 mg | ORAL_TABLET | Freq: Three times a day (TID) | ORAL | 0 refills | Status: DC

## 2020-11-05 NOTE — ED Provider Notes (Signed)
[facility]    CSN: [id number] Arrival date & time: 11/05/20  1013      History   Chief Complaint Chief Complaint  Patient presents with   Leg Pain    HPI Linda Huff is a 50 y.o. female presenting today for evaluation of right calf injury.  Reports that she was playing tennis last night jumped and began to feel spasming sensation in right calf.  Since she has had soreness tightness and limping.  Denies specific fall or trauma to leg.  Denies any ankle or foot pain.  Denies numbness or tingling.  Denies a popping sensation.  HPI  Past Medical History:  Diagnosis Date   Dysrhythmia    Bradycardia   Hypertension 2002   Pre-eclampsia   Perforation of sigmoid colon due to diverticulitis 08/22/2018   Status post exploratory laparotomy 08/21/2018    Patient Active Problem List   Diagnosis Date Noted   Diverticulosis 04/24/2019   History of diverticulitis of colon 04/24/2019   Bradycardia 06/17/2014    Past Surgical History:  Procedure Laterality Date   ILEOSTOMY CLOSURE  11/14/2018   LOOP ILEOSTOMY REVERSAL    ILEOSTOMY CLOSURE N/A 11/14/2018   Procedure: LOOP ILEOSTOMY REVERSAL;  Surgeon: Manus Rudd, MD;  Location: [facility] OR;  Service: General;  Laterality: N/A;   LAPAROTOMY N/A 08/21/2018   Procedure: EXPLORATORY LAPAROTOMY,   SIGMOID  COLECTOMY, LOOP ILEOSTOMY, INCIDENTAL APPENDECTOMY,, APPLICATION OF WOUND VAC;  Surgeon: Manus Rudd, MD;  Location: [facility] OR;  Service: General;  Laterality: N/A;   WISDOM TOOTH EXTRACTION      OB History   No obstetric history on file.      Home Medications    Prior to Admission medications   Medication Sig Start Date End Date Taking? Authorizing Provider  ibuprofen (ADVIL) 800 MG tablet Take 1 tablet (800 mg total) by mouth 3 (three) times daily. 11/05/20  Yes Julia Kulzer C, PA-C  Probiotic Product (PROBIOTIC DAILY PO) Take 1 capsule by mouth daily.    [provider]    Family History Family History   Problem Relation Age of Onset   Hyperlipidemia Mother    Arthritis Mother    High blood pressure Mother    Alcohol abuse Father    Stomach cancer Maternal Grandfather        smoker   Lung cancer Paternal Grandfather    Healthy Daughter    Healthy Son    Osteoporosis Maternal Grandmother    Healthy Son    Stroke Neg Hx    Diabetes Neg Hx    Colon cancer Neg Hx     Social History Social History   Tobacco Use   Smoking status: Never   Smokeless tobacco: Never  Vaping Use   Vaping Use: Never used  Substance Use Topics   Alcohol use: Yes    Alcohol/week: 3.0 standard drinks    Types: 3 Standard drinks or equivalent per week    Comment: 3-4 glasses wine/beer per week   Drug use: No     Allergies   Patient has no known allergies.   Review of Systems Review of Systems  Constitutional:  Negative for fatigue and fever.  Eyes:  Negative for visual disturbance.  Respiratory:  Negative for shortness of breath.   Cardiovascular:  Negative for chest pain.  Gastrointestinal:  Negative for abdominal pain, nausea and vomiting.  Musculoskeletal:  Positive for gait problem and myalgias. Negative for arthralgias and joint swelling.  Skin:  Negative for  color change, rash and wound.  Neurological:  Negative for dizziness, weakness, light-headedness and headaches.    Physical Exam Triage Vital Signs ED Triage Vitals  Enc Vitals Group     BP 11/05/20 1029 105/70     Pulse Rate 11/05/20 1029 (!) 53     Resp 11/05/20 1029 18     Temp 11/05/20 1029 97.9 F (36.6 C)     Temp Source 11/05/20 1029 Oral     SpO2 11/05/20 1029 97 %     Weight --      Height --      Head Circumference --      Peak Flow --      Pain Score 11/05/20 1026 6     Pain Loc --      Pain Edu? --      Excl. in GC? --    No data found.  Updated Vital Signs BP 105/70 (BP Location: Left Arm)   Pulse (!) 53   Temp 97.9 F (36.6 C) (Oral)   Resp 18   LMP 10/10/2020   SpO2 97%   Visual Acuity Right  Eye Distance:   Left Eye Distance:   Bilateral Distance:    Right Eye Near:   Left Eye Near:    Bilateral Near:     Physical Exam Vitals and nursing note reviewed.  Constitutional:      Appearance: She is well-developed.     Comments: No acute distress  HENT:     Head: Normocephalic and atraumatic.     Nose: Nose normal.  Eyes:     Conjunctiva/sclera: Conjunctivae normal.  Cardiovascular:     Rate and Rhythm: Normal rate.  Pulmonary:     Effort: Pulmonary effort is normal. No respiratory distress.  Abdominal:     General: There is no distension.  Musculoskeletal:        General: Normal range of motion.     Cervical back: Neck supple.     Comments: Right gastrocnemius appears more swollen compared to left, very mild bruising noted to medial aspect, no overlying erythema or warmth, mild tenderness to palpation along medial aspect, nontender throughout posterior or lateral aspect of calf Achilles feels firm and intact, nontender to palpation, nontender to medial lateral malleolus, dorsalis pedis 2+ No distal swelling noted  Skin:    General: Skin is warm and dry.  Neurological:     Mental Status: She is alert and oriented to person, place, and time.     UC Treatments / Results  Labs (all labs ordered are listed, but only abnormal results are displayed) Labs Reviewed - No data to display  EKG   Radiology No results found.  Procedures Procedures (including critical care time)  Medications Ordered in UC Medications - No data to display  Initial Impression / Assessment and Plan / UC Course  I have reviewed the triage vital signs and the nursing notes.  Pertinent labs & imaging results that were available during my care of the patient were reviewed by me and considered in my medical decision making (see chart for details).     Suspect medial gastrocnemius tear, no obvious signs of rupture, but monitor for any increased bruising, pain over the next 2 to 3 days,  will recommend compression, Ace wrap, ice elevate anti-inflammatories and activity as tolerated, and activity as tolerated, follow-up with sports medicine if persistent.Discussed strict return precautions. Patient verbalized understanding and is agreeable with plan.    Final Clinical Impressions(s) / UC  Diagnoses   Final diagnoses:  Gastrocnemius tear, left, initial encounter     Discharge Instructions      Use anti-inflammatories for pain/swelling. You may take up to 800 mg Ibuprofen every 8 hours with food. You may supplement Ibuprofen with Tylenol [phone] mg every 8 hours.  Alternate ice and heat Elevate leg Activity as tolerated Follow-up with sports medicine if persistent     ED Prescriptions     Medication Sig Dispense Auth. Provider   ibuprofen (ADVIL) 800 MG tablet Take 1 tablet (800 mg total) by mouth 3 (three) times daily. 21 tablet Rettie Laird, [place] C, PA-C      PDMP not reviewed this encounter.   Lew Dawes, PA-C 11/05/20 1139

## 2020-11-05 NOTE — Discharge Instructions (Addendum)
Use anti-inflammatories for pain/swelling. You may take up to 800 mg Ibuprofen every 8 hours with food. You may supplement Ibuprofen with Tylenol [phone] mg every 8 hours.  Alternate ice and heat Elevate leg Activity as tolerated Follow-up with sports medicine if persistent

## 2020-11-05 NOTE — ED Triage Notes (Addendum)
Pt states last night while playing tennis she injured her right calf. Pt states she did not fall but while jumping she started getting spasms in calf. Pt reports having soreness in right calf with some tightness. Pt is able to amubulate on limb but with abnormal gait.

## 2020-11-09 ENCOUNTER — Other Ambulatory Visit: Payer: Self-pay

## 2020-11-09 ENCOUNTER — Ambulatory Visit (INDEPENDENT_AMBULATORY_CARE_PROVIDER_SITE_OTHER): Payer: BC Managed Care – PPO | Admitting: Family Medicine

## 2020-11-09 ENCOUNTER — Ambulatory Visit: Payer: Self-pay | Admitting: Family Medicine

## 2020-11-09 DIAGNOSIS — M79662 Pain in left lower leg: Secondary | ICD-10-CM

## 2020-11-09 NOTE — Progress Notes (Signed)
   Subjective:    Patient ID: Linda Huff, female    DOB: 1970/10/27, 50 y.o.   MRN: [id number]  HPI Patient presenting following right calf injury.  Injury occurred 5 days ago on 9/14 while she was playing tennis after she jumped and landed on her foot after which she felt a spasm in her calf and weakness.  She was seen at urgent care the following day and was felt to have a medial gastrocnemius tear without signs of rupture and was given Ace wrap.  She has been using the Ace wrap and ice but pain has remained persistent.  She only has pain when she moves her leg a certain way such as walking with her foot straight.  She has been ambulating with her right foot externally rotated as this does not cause pain.  She has a state tennis game coming up in just over 2 weeks and is wondering if she will be able to play tennis then.   Review of Systems     Objective:   Physical Exam Alert, NAD  Right lower leg: Ecchymosis noted along the anterior right shin and posterior calf medial aspect.  Mild tenderness to palpation along medial calf.  Full range of motion with flexion and extension at the knee, all ankle motions.  5/5 strength.  Neurovascularly intact.  Thompson test negative.  Right calf [place]: Partial gastrocnemius tear visualized as evidenced by anechoic areas of fluid along parts of the gastrocnemius insertion sites.     Assessment & Plan:   Partial right gastrocnemius tear  Suspect this should continue to improve with time.  Recommended 5/16 inch heel lifts.  Exercises given.  Compression, icing.  Follow-up in 2 weeks.  Littie Deeds, MD  PGY-2

## 2020-11-09 NOTE — Patient Instructions (Signed)
You have a calf strain Compression sleeve or ace wrap to help with swelling and pain if tolerated. Icing for 15 minutes at a time 3-4 times a day Heel lifts either in temporary orthotics or on their own to prevent further strain. Tylenol and/or aleve for pain if needed. Start ankle range of motion exercises twice a day (up/down and alphabet exercises). When tolerated start theraband strengthening exercises as directed. Follow up with me in 2 weeks.

## 2020-11-10 ENCOUNTER — Encounter: Payer: Self-pay | Admitting: Family Medicine

## 2020-11-11 ENCOUNTER — Ambulatory Visit: Payer: Self-pay | Admitting: Sports Medicine

## 2020-11-23 ENCOUNTER — Ambulatory Visit (INDEPENDENT_AMBULATORY_CARE_PROVIDER_SITE_OTHER): Payer: BC Managed Care – PPO | Admitting: Family Medicine

## 2020-11-23 ENCOUNTER — Encounter: Payer: Self-pay | Admitting: Family Medicine

## 2020-11-23 DIAGNOSIS — M79662 Pain in left lower leg: Secondary | ICD-10-CM

## 2020-11-23 NOTE — Patient Instructions (Signed)
You're doing great! Compression sleeve or ace wrap to help with swelling and pain if tolerated. Icing for 15 minutes at a time 3-4 times a day Heel lifts either in temporary orthotics or on their own to prevent further strain. Tylenol and/or aleve for pain if needed. Start theraband strengthening now with red theraband - 3 sets of 10 once a day. If you do well with this in 1 week do double leg calf raises. Advance then to single leg calf raises then finally doing them on a step. Follow up with me in 1 month if you have any issues. Return to tennis in about 4 weeks would start with hitting ball against a wall, next day lightly hitting with a friend, next day doubles, finally returning to singles.  Consider having a day off in between each of these steps.

## 2020-11-23 NOTE — Progress Notes (Signed)
   Subjective:    Patient ID: Linda Huff, female    DOB: 1970-06-28, 50 y.o.   MRN: [id number]  HPI Patient presenting following right calf injury.  Injury occurred 5 days ago on 9/14 while she was playing tennis after she jumped and landed on her foot after which she felt a spasm in her calf and weakness.  She was seen at urgent care the following day and was felt to have a medial gastrocnemius tear without signs of rupture and was given Ace wrap.  She has been using the Ace wrap and ice but pain has remained persistent.  She only has pain when she moves her leg a certain way such as walking with her foot straight.  She has been ambulating with her right foot externally rotated as this does not cause pain.  She has a state tennis game coming up in just over 2 weeks and is wondering if she will be able to play tennis then.  10/3 Linda Huff is here for follow up of her partial right gastrocnemius tear. She has been adherent to therapy prescribed at last visit. She has been able to complete heel raises at home and wearing heel lifts.   Objective:   General: NAD, nl appearance. Right lower leg: Ecchymosis along anterior right shin and posterior calf still present. No tenderness to palpation of medial calf. 5/5 strength with ankle dorsiflexion and plantarflexion. NV intact. Performed one legged calf raise without pain.   Limited MSK u/s right lower leg: small tear still visualized medial portion of medial gastroc, improved.  Assessment & Plan:   Partial right gastrocnemius tear - Patient is progressing well. Discussed high risk for repeat injury in 6 weeks and asked patient to not return to Tennis at this time.  - continue compression daily for swelling.  - continue icing for 15 minutes at a time , 3-4 times a day - Tylenol and aleve as needed for pain - Starting patient on theraband strength exercises and recommended progression weekly if tolerating, to double leg calf raises, then single  leg calf raises and finally calf raises on a step.  - Discussed completing therapy and plan to return to tennis in 4 weeks if progressing as expected.  - Follow up in one month

## 2021-04-29 ENCOUNTER — Ambulatory Visit (INDEPENDENT_AMBULATORY_CARE_PROVIDER_SITE_OTHER): Payer: BC Managed Care – PPO | Admitting: Family Medicine

## 2021-04-29 ENCOUNTER — Encounter: Payer: Self-pay | Admitting: Family Medicine

## 2021-04-29 VITALS — BP 120/80 | HR 51 | Temp 98.6°F | Ht 64.0 in | Wt 153.0 lb

## 2021-04-29 DIAGNOSIS — Z0000 Encounter for general adult medical examination without abnormal findings: Secondary | ICD-10-CM | POA: Diagnosis not present

## 2021-04-29 DIAGNOSIS — Z9049 Acquired absence of other specified parts of digestive tract: Secondary | ICD-10-CM | POA: Diagnosis not present

## 2021-04-29 DIAGNOSIS — K635 Polyp of colon: Secondary | ICD-10-CM | POA: Diagnosis not present

## 2021-04-29 HISTORY — DX: Polyp of colon: K63.5

## 2021-04-29 LAB — COMPREHENSIVE METABOLIC PANEL
ALT: 20 U/L (ref 0–35)
AST: 37 U/L (ref 0–37)
Albumin: 4.3 g/dL (ref 3.5–5.2)
Alkaline Phosphatase: 63 U/L (ref 39–117)
BUN: 11 mg/dL (ref 6–23)
CO2: 27 mEq/L (ref 19–32)
Calcium: 9.2 mg/dL (ref 8.4–10.5)
Chloride: 102 mEq/L (ref 96–112)
Creatinine, Ser: 0.79 mg/dL (ref 0.40–1.20)
GFR: 88.33 mL/min (ref >60.00)
Glucose, Bld: 102 mg/dL — ABNORMAL HIGH (ref 70–99)
Potassium: 4.3 mEq/L (ref 3.5–5.1)
Sodium: 136 mEq/L (ref 135–145)
Total Bilirubin: 1.2 mg/dL (ref 0.2–1.2)
Total Protein: 6.7 g/dL (ref 6.0–8.3)

## 2021-04-29 LAB — CBC WITH DIFFERENTIAL/PLATELET
Basophils Absolute: 0 10*3/uL (ref 0.0–0.1)
Basophils Relative: 0.6 % (ref 0.0–3.0)
Eosinophils Absolute: 0.1 10*3/uL (ref 0.0–0.7)
Eosinophils Relative: 2.7 % (ref 0.0–5.0)
HCT: 37.9 % (ref 36.0–46.0)
Hemoglobin: 12.9 g/dL (ref 12.0–15.0)
Lymphocytes Relative: 21.5 % (ref 12.0–46.0)
Lymphs Abs: 1 10*3/uL (ref 0.7–4.0)
MCHC: 34.1 g/dL (ref 30.0–36.0)
MCV: 88.9 fl (ref 78.0–100.0)
Monocytes Absolute: 0.4 10*3/uL (ref 0.1–1.0)
Monocytes Relative: 9.5 % (ref 3.0–12.0)
Neutro Abs: 3 10*3/uL (ref 1.4–7.7)
Neutrophils Relative %: 65.7 % (ref 43.0–77.0)
Platelets: 258 10*3/uL (ref 150.0–400.0)
RBC: 4.26 Mil/uL (ref 3.87–5.11)
RDW: 12.7 % (ref 11.5–15.5)
WBC: 4.6 10*3/uL (ref 4.0–10.5)

## 2021-04-29 LAB — LIPID PANEL
Cholesterol: 147 mg/dL (ref 0–200)
HDL: 79.3 mg/dL (ref >39.00)
LDL Cholesterol: 56 mg/dL (ref 0–99)
NonHDL: 68.14
Total CHOL/HDL Ratio: 2
Triglycerides: 61 mg/dL (ref 0.0–149.0)
VLDL: 12.2 mg/dL (ref 0.0–40.0)

## 2021-04-29 NOTE — Patient Instructions (Signed)
Please return in 12 months for your annual complete physical; please come fasting.  ?Please sign a release of records for Dr. Luan Moore office: colonscopy and pathology reports.  ? ? ?If you have any questions or concerns, please don't hesitate to send me a message via MyChart or call the office at [phone]. Thank you for visiting with [place] today! It's our pleasure caring for you.  ? ?Please do these things to maintain good health! ? ?Exercise at least 30-45 minutes a day,  4-5 days a week.  ?Eat a low-fat diet with lots of fruits and vegetables, up to 7-9 servings per day. ?Drink plenty of water daily. Try to drink 8 8oz glasses per day. ?Seatbelts can save your life. Always wear your seatbelt. ?Place Smoke Detectors on every level of your home and check batteries every year. ?Schedule an appointment with an eye doctor for an eye exam every 1-2 years ?Safe sex - use condoms to protect yourself from STDs if you could be exposed to these types of infections. Use birth control if you do not want to become pregnant and are sexually active. ?Avoid heavy alcohol use. If you drink, keep it to less than 2 drinks/day and not every day. ?Health Care Power of Attorney.  Choose someone you trust that could speak for you if you became unable to speak for yourself. ?Depression is common in our stressful world.If you're feeling down or losing interest in things you normally enjoy, please come in for a visit. ?If anyone is threatening or hurting you, please get help. Physical or Emotional Violence is never OK.   ?

## 2021-04-29 NOTE — Progress Notes (Signed)
Subjective  Chief Complaint  Patient presents with   Annual Exam    No concerns today. 2 cups of coffee.    Diverticulosis   Bradycardia    HPI: Linda Huff is a 51 y.o. female who presents to [facility] [place] today for a Female Wellness Visit.  She also has the concerns and/or needs as listed above in the chief complaint. These will be addressed in addition to the Health Maintenance Visit.   Wellness Visit: annual visit with health maintenance review and exam without Pap  HM: sees gyn for female wellness. Screens are current. Reports colonoscopy 2015 with polyps. Dr. Evette CristalGanem. Plays tennis. Would like to lose a few pounds.  Chronic disease management visit and/or acute problem visit: H/o partial colectomy due to ruptured colon; diverticulitis. No complications. Feels well Assessment  1. Annual physical exam   2. Polyp of colon, unspecified part of colon, unspecified type   3. History of colectomy      Plan  Female Wellness Visit: Age appropriate Health Maintenance and Prevention measures were discussed with patient. Included topics are cancer screening recommendations, ways to keep healthy (see AVS) including dietary and exercise recommendations, regular eye and dental care, use of seat belts, and avoidance of moderate alcohol use and tobacco use.  BMI: discussed patient's BMI and encouraged positive lifestyle modifications to help get to or maintain a target BMI. HM needs and immunizations were addressed and ordered. See below for orders. See HM and immunization section for updates. Routine labs and screening tests ordered including cmp, cbc and lipids where appropriate. Discussed recommendations regarding Vit D and calcium supplementation (see AVS)  Chronic disease f/u and/or acute problem visit: (deemed necessary to be done in addition to the wellness visit): Colon polyps:  will request records to ensure screen/surveillance is current.   Follow up:  Return in about 1 year (around 04/30/2022) for complete physical.   Orders Placed This Encounter  Procedures   CBC with Differential/Platelet   Comprehensive metabolic panel   Lipid panel   Hepatitis C antibody   No orders of the defined types were placed in this encounter.      Body mass index is 26.26 kg/m. Wt Readings from Last 3 Encounters:  04/29/21 153 lb (69.4 kg)  11/09/20 145 lb (65.8 kg)  04/24/19 144 lb 3.2 oz (65.4 kg)     Patient Active Problem List   Diagnosis Date Noted   History of colectomy 04/29/2021   Colon polyp 04/29/2021    Colonoscopy 2015 Dr. Evette CristalGanem, [facility]    Diverticulosis 04/24/2019   History of diverticulitis of colon 04/24/2019    Perforated colon 2020    Bradycardia 06/17/2014   Health Maintenance  Topic Date Due   Hepatitis C Screening  Never done   COVID-19 Vaccine (3 - Pfizer risk series) 05/15/2021 (Originally 07/08/2019)   COLONOSCOPY (Pts [id number] Insurance coverage will need to be confirmed)  04/22/2023   PAP SMEAR-Modifier  05/11/2023   TETANUS/TDAP  04/23/2029   HIV Screening  Completed   HPV VACCINES  Aged Out   INFLUENZA VACCINE  Discontinued   Immunization History  Administered Date(s) Administered   Influenza,inj,Quad PF,6+ Mos 12/24/2014, 11/08/2018   Influenza-Unspecified 11/01/2018   PFIZER(Purple Top)SARS-COV-2 Vaccination 05/16/2019, 06/10/2019   Tdap 02/22/2011, 04/24/2019   We updated and reviewed the patient's past history in detail and it is documented below. Allergies: Patient  reports current alcohol use of about 3.0 standard drinks per week. Past Medical  History Patient  has a past medical history of Colon polyp (04/29/2021), Dysrhythmia, Hypertension (2002), Perforation of sigmoid colon due to diverticulitis (08/22/2018), and Status post exploratory laparotomy (08/21/2018). Past Surgical History Patient  has a past surgical history that includes Wisdom tooth extraction; laparotomy (N/A, 08/21/2018); Ileostomy  closure (11/14/2018); and Ileostomy closure (N/A, 11/14/2018). Social History   Socioeconomic History   Marital status: Married    Spouse name: Arlys John   Number of children: 3   Years of education: Not on file   Highest education level: Not on file  Occupational History   Occupation: Investment banker, corporate; 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  Tobacco Use   Smoking status: Never   Smokeless tobacco: Never  Vaping Use   Vaping Use: Never used  Substance and Sexual Activity   Alcohol use: Yes    Alcohol/week: 3.0 standard drinks    Types: 3 Standard drinks or equivalent per week    Comment: 3-4 glasses wine/beer per week   Drug use: No   Sexual activity: Yes    Birth control/protection: Other-see comments    Comment: husband had vasectomy  Other Topics Concern   Not on file  Social History Narrative   Not on file   Social Determinants of Health   Financial Resource Strain: Not on file  Food Insecurity: Not on file  Transportation Needs: Not on file  Physical Activity: Not on file  Stress: Not on file  Social Connections: Not on file   Family History  Problem Relation Age of Onset   Hyperlipidemia Mother    Arthritis Mother    High blood pressure Mother    Alcohol abuse Father    Stomach cancer Maternal Grandfather        smoker   Lung cancer Paternal Actor    Healthy Daughter    Healthy Son    Osteoporosis Maternal Grandmother    Healthy Son    Stroke Neg Hx    Diabetes Neg Hx    Colon cancer Neg Hx     Review of Systems: Constitutional: negative for fever or malaise Ophthalmic: negative for photophobia, double vision or loss of vision Cardiovascular: negative for chest pain, dyspnea on exertion, or new LE swelling Respiratory: negative for SOB or persistent cough Gastrointestinal: negative for abdominal pain, change in bowel habits or melena Genitourinary: negative for dysuria or gross hematuria, no abnormal uterine bleeding or disharge Musculoskeletal: negative for new  gait disturbance or muscular weakness Integumentary: negative for new or persistent rashes, no breast lumps Neurological: negative for TIA or stroke symptoms Psychiatric: negative for SI or delusions Allergic/Immunologic: negative for hives  Patient Care Team    Relationship Specialty Notifications Start End  Willow Ora, MD PCP - General Family Medicine  04/24/19   Ginette Otto, [facility] Consulting Physician Obstetrics and Gynecology  04/24/19   Freddy Finner, MD Consulting Physician Obstetrics and Gynecology  04/24/19   Jacqlyn Krauss, MD Consulting Physician Dermatology  04/24/19   Manus Rudd, MD Consulting Physician General Surgery  04/24/19   Graylin Shiver, MD Consulting Physician Gastroenterology  04/24/19   Rollene Rotunda, MD Consulting Physician Cardiology  04/24/19     Objective  Vitals: BP 120/80    Pulse (!) 51    Temp 98.6 F (37 C) (Temporal)    Ht 5\' 4"  (1.626 m)    Wt 153 lb (69.4 kg)    LMP 04/27/2021 (Exact Date)    SpO2 98%    BMI 26.26 kg/m  General:  Well developed, well nourished, no acute distress  Psych:  Alert and orientedx3,normal mood and affect HEENT:  Normocephalic, atraumatic, non-icteric sclera, PERRL, supple neck without adenopathy, mass or thyromegaly Cardiovascular:  Normal S1, S2, RRR without gallop, rub or murmur Respiratory:  Good breath sounds bilaterally, CTAB with normal respiratory effort Gastrointestinal: normal bowel sounds, soft, non-tender, no noted masses. No HSM MSK: no deformities, contusions. Joints are without erythema or swelling.  Skin:  Warm, no rashes or suspicious lesions noted Neurologic:    Mental status is normal. Gross motor and sensory exams are normal. Normal gait. No tremor    Commons side effects, risks, benefits, and alternatives for medications and treatment plan prescribed today were discussed, and the patient expressed understanding of the given instructions. Patient is instructed to call or message  via MyChart if he/she has any questions or concerns regarding our treatment plan. No barriers to understanding were identified. We discussed Red Flag symptoms and signs in detail. Patient expressed understanding regarding what to do in case of urgent or emergency type symptoms.  Medication list was reconciled, printed and provided to the patient in AVS. Patient instructions and summary information was reviewed with the patient as documented in the AVS. This note was prepared with assistance of Dragon voice recognition software. Occasional wrong-word or sound-a-like substitutions may have occurred due to the inherent limitations of voice recognition software  This visit occurred during the SARS-CoV-2 public health emergency.  Safety protocols were in place, including screening questions prior to the visit, additional usage of staff PPE, and extensive cleaning of exam room while observing appropriate contact time as indicated for disinfecting solutions.

## 2021-05-21 ENCOUNTER — Encounter: Payer: Self-pay | Admitting: Family Medicine

## 2021-05-21 ENCOUNTER — Ambulatory Visit (INDEPENDENT_AMBULATORY_CARE_PROVIDER_SITE_OTHER): Payer: BC Managed Care – PPO | Admitting: Family Medicine

## 2021-05-21 VITALS — BP 145/86 | HR 48 | Temp 98.4°F | Ht 64.0 in | Wt 154.0 lb

## 2021-05-21 DIAGNOSIS — J0110 Acute frontal sinusitis, unspecified: Secondary | ICD-10-CM | POA: Diagnosis not present

## 2021-05-21 MED ORDER — AMOXICILLIN-POT CLAVULANATE 875-125 MG PO TABS: 1.0000 | ORAL_TABLET | Freq: Two times a day (BID) | ORAL | 0 refills | Status: AC

## 2021-05-21 NOTE — Progress Notes (Signed)
? ?Subjective  ? ?CC:  ?Chief Complaint  ?Patient presents with  ? Sinusitis  ?  Started last Wednesday with Lightheadedness, and fogginess. Negative Covid test-Sunday. She has taken Zyrtec, and Sudafed. Pt states her thermometer does not work, so she doesn't know if she had a fever.   ? ? ?HPI: Linda Huff is a 51 y.o. female who presents to the office today to address the problems listed above in the chief complaint. ?Patient reports sinus congestion and pressure with thick drainage, mild nonproductive cough, ear pressure without pain, and malaise.  Symptoms have been present for over a week days. Missed work for several days due to fatigue, headache and malasie. She denies high fevers, GI symptoms, shortness of breath. Shehas had sinus infections in the past and this feels similar.  Patient is not a non-smoker.  No history of asthma or COPD. ? ?I reviewed the patients updated PMH, FH, and SocHx.  ?  ?Patient Active Problem List  ? Diagnosis Date Noted  ? History of colectomy 04/29/2021  ? Colon polyp 04/29/2021  ? Diverticulosis 04/24/2019  ? History of diverticulitis of colon 04/24/2019  ? Bradycardia 06/17/2014  ? ?Current Meds  ?Medication Sig  ? ammonium lactate (LAC-HYDRIN) 12 % lotion 1 application  ? amoxicillin-clavulanate (AUGMENTIN) 875-125 MG tablet Take 1 tablet by mouth 2 (two) times daily for 10 days.  ? Probiotic Product (PROBIOTIC DAILY PO) Take 1 capsule by mouth daily.  ? ? ?Review of Systems: ?Cardiovascular: negative for chest pain ?Respiratory: negative for SOB or persistent cough ?Gastrointestinal: negative for abdominal pain ?Genitourinary: negative for dysuria or gross hematuria ? ?Objective  ?Vitals: BP (!) 145/86   Pulse (!) 48   Temp 98.4 ?F (36.9 ?C) (Temporal)   Ht 5\' 4"  (1.626 m)   Wt 154 lb (69.9 kg)   LMP 04/27/2021 (Exact Date)   SpO2 99%   BMI 26.43 kg/m?  ?General: no acute distress  ?Psych:  Alert and oriented, normal mood and affect ?HEENT:  Normocephalic,  atraumatic, TMs with serous effusions or retraction w/o erythema, nasal mucosa is red with purulent drainage, tender frontal sinus present, OP mild erythematous w/o eudate, supple neck without LAD ?Cardiovascular:  RRR without murmur or gallop. no peripheral edema ?Respiratory:  Good breath sounds bilaterally, CTAB with normal respiratory effort ?Skin:  Warm, no rashes ?Neurologic:   Mental status is normal.  ?Assessment  ?1. Acute non-recurrent frontal sinusitis   ? ?  ?Plan  ? ?Sinusitis: History and exam is most consistent with bacterial sinus infection.  Etiology and prognosis discussed with patient.  Recommend antibiotics as ordered below.  Patient to complete course of antibiotics, use supportive medications like mucolytics and decongestants as needed.  May use Tylenol or Advil if needed.  Symptoms should improve over the next 2 weeks.  Patient will return or call if symptoms persist or worsen. ? ?Follow up: No follow-ups on file.  ? ?Commons side effects, risks, benefits, and alternatives for medications and treatment plan prescribed today were discussed, and the patient expressed understanding of the given instructions. Patient is instructed to call or message via MyChart if he/she has any questions or concerns regarding our treatment plan. No barriers to understanding were identified. We discussed Red Flag symptoms and signs in detail. Patient expressed understanding regarding what to do in case of urgent or emergency type symptoms.  ?Medication list was reconciled, printed and provided to the patient in AVS. Patient instructions and summary information was reviewed with the patient  as documented in the AVS. ?This note was prepared with assistance of Systems analyst. Occasional wrong-word or sound-a-like substitutions may have occurred due to the inherent limitations of voice recognition software ? ?No orders of the defined types were placed in this encounter. ? ?Meds ordered this  encounter  ?Medications  ? amoxicillin-clavulanate (AUGMENTIN) 875-125 MG tablet  ?  Sig: Take 1 tablet by mouth 2 (two) times daily for 10 days.  ?  Dispense:  20 tablet  ?  Refill:  0  ? ?  ? ? ? ? ?

## 2021-05-21 NOTE — Patient Instructions (Signed)
Please follow up if symptoms do not improve or as needed.   ? ?Sinusitis, Adult ?Sinusitis is soreness and swelling (inflammation) of your sinuses. Sinuses are hollow spaces in the bones around your face. They are located: ?Around your eyes. ?In the middle of your forehead. ?Behind your nose. ?In your cheekbones. ?Your sinuses and nasal passages are lined with a fluid called mucus. Mucus drains out of your sinuses. Swelling can trap mucus in your sinuses. This lets germs (bacteria, virus, or fungus) grow, which leads to infection. Most of the time, this condition is caused by a virus. ?What are the causes? ?This condition is caused by: ?Allergies. ?Asthma. ?Germs. ?Things that block your nose or sinuses. ?Growths in the nose (nasal polyps). ?Chemicals or irritants in the air. ?Fungus (rare). ?What increases the risk? ?You are more likely to develop this condition if: ?You have a weak body defense system (immune system). ?You do a lot of swimming or diving. ?You use nasal sprays too much. ?You smoke. ?What are the signs or symptoms? ?The main symptoms of this condition are pain and a feeling of pressure around the sinuses. Other symptoms include: ?Stuffy nose (congestion). ?Runny nose (drainage). ?Swelling and warmth in the sinuses. ?Headache. ?Toothache. ?A cough that may get worse at night. ?Mucus that collects in the throat or the back of the nose (postnasal drip). ?Being unable to smell and taste. ?Being very tired (fatigue). ?A fever. ?Sore throat. ?Bad breath. ?How is this diagnosed? ?This condition is diagnosed based on: ?Your symptoms. ?Your medical history. ?A physical exam. ?Tests to find out if your condition is short-term (acute) or long-term (chronic). Your doctor may: ?Check your nose for growths (polyps). ?Check your sinuses using a tool that has a light (endoscope). ?Check for allergies or germs. ?Do imaging tests, such as an MRI or CT scan. ?How is this treated? ?Treatment for this condition depends  on the cause and whether it is short-term or long-term. ?If caused by a virus, your symptoms should go away on their own within 10 days. You may be given medicines to relieve symptoms. They include: ?Medicines that shrink swollen tissue in the nose. ?Medicines that treat allergies (antihistamines). ?A spray that treats swelling of the nostrils.  ?Rinses that help get rid of thick mucus in your nose (nasal saline washes). ?If caused by bacteria, your doctor may wait to see if you will get better without treatment. You may be given antibiotic medicine if you have: ?A very bad infection. ?A weak body defense system. ?If caused by growths in the nose, you may need to have surgery. ?Follow these instructions at home: ?Medicines ?Take, use, or apply over-the-counter and prescription medicines only as told by your doctor. These may include nasal sprays. ?If you were prescribed an antibiotic medicine, take it as told by your doctor. Do not stop taking the antibiotic even if you start to feel better. ?Hydrate and humidify ? ?Drink enough water to keep your pee (urine) pale yellow. ?Use a cool mist humidifier to keep the humidity level in your home above 50%. ?Breathe in steam for 10-15 minutes, 3-4 times a day, or as told by your doctor. You can do this in the bathroom while a hot shower is running. ?Try not to spend time in cool or dry air. ?Rest ?Rest as much as you can. ?Sleep with your head raised (elevated). ?Make sure you get enough sleep each night. ?General instructions ? ?Put a warm, moist washcloth on your face 3-4 times a   day, or as often as told by your doctor. This will help with discomfort. ?Wash your hands often with soap and water. If there is no soap and water, use hand sanitizer. ?Do not smoke. Avoid being around people who are smoking (secondhand smoke). ?Keep all follow-up visits as told by your doctor. This is important. ?Contact a doctor if: ?You have a fever. ?Your symptoms get worse. ?Your symptoms do  not get better within 10 days. ?Get help right away if: ?You have a very bad headache. ?You cannot stop throwing up (vomiting). ?You have very bad pain or swelling around your face or eyes. ?You have trouble seeing. ?You feel confused. ?Your neck is stiff. ?You have trouble breathing. ?Summary ?Sinusitis is swelling of your sinuses. Sinuses are hollow spaces in the bones around your face. ?This condition is caused by tissues in your nose that become inflamed or swollen. This traps germs. These can lead to infection. ?If you were prescribed an antibiotic medicine, take it as told by your doctor. Do not stop taking it even if you start to feel better. ?Keep all follow-up visits as told by your doctor. This is important. ?This information is not intended to replace advice given to you by your health care provider. Make sure you discuss any questions you have with your health care provider. ?Document Revised: 07/10/2017 Document Reviewed: 07/10/2017 ?Elsevier Patient Education ? 2022 [employer] ? ?

## 2021-08-27 DIAGNOSIS — L237 Allergic contact dermatitis due to plants, except food: Secondary | ICD-10-CM | POA: Diagnosis not present

## 2021-08-27 DIAGNOSIS — Z6825 Body mass index (BMI) 25.0-25.9, adult: Secondary | ICD-10-CM | POA: Diagnosis not present

## 2021-12-31 DIAGNOSIS — Z01419 Encounter for gynecological examination (general) (routine) without abnormal findings: Secondary | ICD-10-CM | POA: Diagnosis not present

## 2021-12-31 DIAGNOSIS — Z6826 Body mass index (BMI) 26.0-26.9, adult: Secondary | ICD-10-CM | POA: Diagnosis not present

## 2021-12-31 DIAGNOSIS — Z124 Encounter for screening for malignant neoplasm of cervix: Secondary | ICD-10-CM | POA: Diagnosis not present

## 2021-12-31 DIAGNOSIS — Z1231 Encounter for screening mammogram for malignant neoplasm of breast: Secondary | ICD-10-CM | POA: Diagnosis not present

## 2022-01-17 DIAGNOSIS — H5212 Myopia, left eye: Secondary | ICD-10-CM | POA: Diagnosis not present

## 2022-01-17 DIAGNOSIS — H43811 Vitreous degeneration, right eye: Secondary | ICD-10-CM | POA: Diagnosis not present

## 2022-02-01 ENCOUNTER — Encounter (HOSPITAL_BASED_OUTPATIENT_CLINIC_OR_DEPARTMENT_OTHER): Payer: Self-pay | Admitting: Emergency Medicine

## 2022-02-01 ENCOUNTER — Emergency Department (HOSPITAL_BASED_OUTPATIENT_CLINIC_OR_DEPARTMENT_OTHER)
Admission: EM | Admit: 2022-02-01 | Discharge: 2022-02-01 | Disposition: A | Discharge status: Home / Self Care | Payer: BC Managed Care – PPO | Attending: Emergency Medicine | Admitting: Emergency Medicine

## 2022-02-01 ENCOUNTER — Other Ambulatory Visit: Payer: Self-pay

## 2022-02-01 ENCOUNTER — Emergency Department (HOSPITAL_BASED_OUTPATIENT_CLINIC_OR_DEPARTMENT_OTHER): Payer: BC Managed Care – PPO

## 2022-02-01 DIAGNOSIS — M5432 Sciatica, left side: Secondary | ICD-10-CM | POA: Insufficient documentation

## 2022-02-01 DIAGNOSIS — M79605 Pain in left leg: Secondary | ICD-10-CM | POA: Diagnosis not present

## 2022-02-01 DIAGNOSIS — X500XXA Overexertion from strenuous movement or load, initial encounter: Secondary | ICD-10-CM | POA: Insufficient documentation

## 2022-02-01 DIAGNOSIS — M5442 Lumbago with sciatica, left side: Secondary | ICD-10-CM | POA: Diagnosis not present

## 2022-02-01 DIAGNOSIS — M79662 Pain in left lower leg: Secondary | ICD-10-CM | POA: Diagnosis not present

## 2022-02-01 MED ORDER — PREDNISONE 20 MG PO TABS: ORAL_TABLET | ORAL | 0 refills | Status: DC

## 2022-02-01 MED ORDER — LIDOCAINE 5 % EX PTCH
2.0000 | MEDICATED_PATCH | CUTANEOUS | Status: DC
  Administered 2022-02-01: 2 via TRANSDERMAL
  Filled 2022-02-01: qty 2

## 2022-02-01 MED ORDER — PREDNISONE 50 MG PO TABS
60.0000 mg | ORAL_TABLET | Freq: Once | ORAL | Status: AC
  Administered 2022-02-01: 60 mg via ORAL
  Filled 2022-02-01: qty 1

## 2022-02-01 MED ORDER — LIDOCAINE 5 % EX PTCH: 1.0000 | MEDICATED_PATCH | CUTANEOUS | 0 refills | Status: DC

## 2022-02-01 MED ORDER — KETOROLAC TROMETHAMINE 60 MG/2ML IM SOLN
60.0000 mg | Freq: Once | INTRAMUSCULAR | Status: AC
  Administered 2022-02-01: 60 mg via INTRAMUSCULAR
  Filled 2022-02-01: qty 2

## 2022-02-01 NOTE — ED Triage Notes (Signed)
Pt reports left leg pain from her left buttock to left foot, pt reports she attempted to lift a vehicle that fell from a jack onto her husband 01/15/22, since then she has had burning pain down her leg with intermittent shooting pain worsening at night when lying down, further endorses numbness from left calf to her toes

## 2022-02-01 NOTE — ED Provider Notes (Signed)
[facility] EMERGENCY DEPT Provider Note   CSN: [id number] Arrival date & time: 02/01/22  0121     History  Chief Complaint  Patient presents with   Leg Pain    Linda Huff is a 51 y.o. female.  The history is provided by the patient.  Leg Pain Location:  Buttock Time since incident:  2 weeks Injury: yes   Mechanism of injury comment:  Heavy lifting Buttock location:  L buttock Pain details:    Quality:  Shooting   Radiates to:  L leg   Severity:  Severe   Onset quality:  Sudden   Duration:  2 weeks   Timing:  Constant   Progression:  Unchanged Chronicity:  New Relieved by:  Nothing Worsened by:  Nothing Ineffective treatments:  Acetaminophen and NSAIDs Associated symptoms: fever   Associated symptoms: no back pain, no muscle weakness, no stiffness and no swelling        Home Medications Prior to Admission medications   Medication Sig Start Date End Date Taking? Authorizing Provider  lidocaine (LIDODERM) 5 % Place 1 patch onto the skin daily. Remove & Discard patch within 12 hours or as directed by MD 02/01/22  Yes Hardy Harcum, MD  predniSONE (DELTASONE) 20 MG tablet 3 tabs po day one, then 2 po daily x 4 days 02/01/22  Yes Marysue Fait, MD  ammonium lactate (LAC-HYDRIN) 12 % lotion 1 application 01/01/19   [provider]  Probiotic Product (PROBIOTIC DAILY PO) Take 1 capsule by mouth daily.    [provider]      Allergies    Patient has no known allergies.    Review of Systems   Review of Systems  Constitutional:  Positive for fever.  HENT:  Negative for facial swelling.   Gastrointestinal:  Negative for abdominal pain.  Musculoskeletal:  Positive for arthralgias. Negative for back pain, gait problem and stiffness.  Neurological:  Negative for weakness and numbness.  All other systems reviewed and are negative.   Physical Exam Updated Vital Signs BP 132/81   Pulse (!) 53   Temp 98.4 F (36.9 C) (Oral)    Resp 16   Ht 5\' 4"  (1.626 m)   Wt 68 kg   LMP 01/08/2022 (Approximate)   SpO2 97%   BMI 25.75 kg/m  Physical Exam Vitals and nursing note reviewed.  Constitutional:      General: She is not in acute distress.    Appearance: Normal appearance. She is well-developed.  HENT:     Head: Normocephalic and atraumatic.     Nose: Nose normal.  Eyes:     Pupils: Pupils are equal, round, and reactive to light.  Cardiovascular:     Rate and Rhythm: Normal rate and regular rhythm.     Pulses: Normal pulses.     Heart sounds: Normal heart sounds.  Pulmonary:     Effort: Pulmonary effort is normal. No respiratory distress.     Breath sounds: Normal breath sounds.  Abdominal:     General: Abdomen is flat. Bowel sounds are normal. There is no distension.     Palpations: Abdomen is soft.     Tenderness: There is no abdominal tenderness. There is no guarding or rebound.  Genitourinary:    Vagina: No vaginal discharge.  Musculoskeletal:        General: Normal range of motion.     Cervical back: Normal and neck supple.     Thoracic back: Normal.     Lumbar  back: Normal.     Left knee: No LCL laxity, MCL laxity, ACL laxity or PCL laxity.Normal pulse.     Instability Tests: Anterior drawer test negative. Posterior drawer test negative. Medial McMurray test negative and lateral McMurray test negative.     Left lower leg: Normal.     Left ankle: Normal.     Left Achilles Tendon: Normal.     Left foot: Normal. No bony tenderness or crepitus. Normal pulse.     Comments: Non tender spine no step offs intact L5s1 intact sensation groin and entire LLE.  All tendons intact in LLE.  No patella alta or baja.    Skin:    General: Skin is warm and dry.     Capillary Refill: Capillary refill takes less than 2 seconds.     Findings: No erythema or rash.  Neurological:     General: No focal deficit present.     Mental Status: She is alert and oriented to person, place, and time.     Sensory: No sensory  deficit.     Motor: No weakness.     Deep Tendon Reflexes: Reflexes normal.  Psychiatric:        Mood and Affect: Mood normal.     ED Results / Procedures / Treatments   Labs (all labs ordered are listed, but only abnormal results are displayed) Labs Reviewed - No data to display  EKG None  Radiology US Venous Img Lower Unilateral Left (DVT)  Result Date: 02/01/2022 CLINICAL DATA:  History of trauma with tingling pain from the left buttock to the left foot. EXAM: LEFT LOWER EXTREMITY VENOUS DOPPLER ULTRASOUND TECHNIQUE: Gray-scale sonography with compression, as well as color and duplex ultrasound, were performed to evaluate the deep venous system(s) from the level of the common femoral vein through the popliteal and proximal calf veins. COMPARISON:  None Available. FINDINGS: VENOUS Normal compressibility of the common femoral, superficial femoral, and popliteal veins, as well as the visualized calf veins. Visualized portions of profunda femoral vein and great saphenous vein unremarkable. No filling defects to suggest DVT on grayscale or color Doppler imaging. Doppler waveforms show normal direction of venous flow, normal respiratory plasticity and response to augmentation. Limited views of the contralateral common femoral vein are unremarkable. OTHER None. Limitations: none IMPRESSION: No evidence of DVT within the LEFT lower extremity. Electronically Signed   By: Aram Candela M.D.   On: 02/01/2022 02:39    Procedures Procedures    Medications Ordered in ED Medications  lidocaine (LIDODERM) 5 % 2 patch (has no administration in time range)  ketorolac (TORADOL) injection 60 mg (has no administration in time range)  predniSONE (DELTASONE) tablet 60 mg (has no administration in time range)    ED Course/ Medical Decision Making/ A&P                           Medical Decision Making Patient with heavy lifting and then buttock pain and pain that radiates down LLE  Amount and/or  Complexity of Data Reviewed External Data Reviewed: notes.    Details: Previous notes reviewed   Risk Prescription drug management.    Final Clinical Impression(s) / ED Diagnoses Final diagnoses:  Sciatica of left side   Return for intractable cough, coughing up blood, fevers > 100.4 unrelieved by medication, shortness of breath, intractable vomiting, chest pain, shortness of breath, weakness, numbness, changes in speech, facial asymmetry, abdominal pain, passing out, Inability to tolerate liquids  or food, cough, altered mental status or any concerns. No signs of systemic illness or infection. The patient is nontoxic-appearing on exam and vital signs are within normal limits.  I have reviewed the triage vital signs and the nursing notes. Pertinent labs & imaging results that were available during my care of the patient were reviewed by me and considered in my medical decision making (see chart for details). After history, exam, and medical workup I feel the patient has been appropriately medically screened and is safe for discharge home. Pertinent diagnoses were discussed with the patient. Patient was given return precautions.    Rx / DC Orders ED Discharge Orders          Ordered    predniSONE (DELTASONE) 20 MG tablet        02/01/22 0342    lidocaine (LIDODERM) 5 %  Every 24 hours        02/01/22 0342              Tamme Mozingo, MD 02/01/22 [phone]

## 2022-02-09 ENCOUNTER — Encounter: Payer: Self-pay | Admitting: Family Medicine

## 2022-02-09 ENCOUNTER — Ambulatory Visit (INDEPENDENT_AMBULATORY_CARE_PROVIDER_SITE_OTHER): Payer: Commercial Managed Care - PPO | Admitting: Family Medicine

## 2022-02-09 VITALS — BP 130/80 | HR 60 | Temp 98.3°F | Ht 64.0 in | Wt 150.8 lb

## 2022-02-09 DIAGNOSIS — M5416 Radiculopathy, lumbar region: Secondary | ICD-10-CM

## 2022-02-09 MED ORDER — GABAPENTIN 300 MG PO CAPS: 300.0000 mg | ORAL_CAPSULE | Freq: Two times a day (BID) | ORAL | 0 refills | Status: DC

## 2022-02-09 NOTE — Patient Instructions (Signed)
Please follow up as scheduled for your next visit with me: 05/03/2022   If you have any questions or concerns, please don't hesitate to send me a message via MyChart or call the office at [phone]. Thank you for visiting with [place] today! It's our pleasure caring for you.   Herniated Disk  A herniated disk, also called a ruptured disk or slipped disk, occurs when a disk in the spine bulges out too far. Between the bones in the spine (vertebrae), there are oval disks that are made of a soft, spongy center filled with liquid that is surrounded by a tough outer ring. The disks connect the vertebrae, help the spine move, and keep the bones from rubbing against each other when you move. When you have a herniated disk, the spongy center of the disk bulges out or breaks through the outer ring. The spongy center can press on a nerve between the vertebrae and cause pain. This can occur anywhere in the back or neck area, but the lower back is most commonly affected. What are the causes? This condition may be caused by: Age-related wear and tear. Sudden injury, such as a strain or sprain. What increases the risk? The following factors may make you more likely to develop this condition: Aging. This is the main risk factor for a herniated disk. Being a man who is 14-28 years old. Frequently doing activities that involve heavy lifting, bending, or twisting. Not getting enough exercise. Being overweight. Using tobacco products. What are the signs or symptoms? Symptoms may vary depending on where your herniated disk is located. A herniated disk in the lower back may cause: Sharp pain in part of the hips, buttocks, or legs. Sharp pain in the lower back, spreading down through the leg into the foot (sciatica). A herniated disk in the neck may cause dizziness and vertigo. It may also cause pain or weakness in the neck, shoulder, or upper arm, forearm, or fingers. You may also have muscle weakness. You may  have trouble: Lifting your leg or arm. Standing on your toes. Squeezing tightly with one of your hands. Other symptoms may include: Numbness or tingling in the affected areas of the hands, arms, feet, or legs. Inability to control when you urinate or when you have bowel movements. This is a rare but serious sign of a severe herniated disk in the lower back. How is this diagnosed? This condition may be diagnosed based on: Your symptoms. Your medical history. A physical exam. The exam may include: A straight-leg test. For this test, you will lie on your back while your health care provider lifts your leg, keeping your knee straight. If you feel pain, you likely have a herniated disk. Neurologic tests. These include checking for numbness, reflexes, muscle strength, and problems with posture. Imaging tests, such as: X-rays. MRI. CT scan. Electromyogram (EMG) to check the nerves that control muscles. How is this treated? Treatment for this condition may include: A period of light, painless activity for a few days to several weeks. Complete bed rest is not recommended. If you have a herniated disk in your lower back, avoid sitting as it increases pressure on the disk. Medicines. These may include: NSAIDs, such as ibuprofen, to help reduce pain and swelling. Muscle relaxants to prevent sudden tightening of the back muscles (back spasms). Prescription pain medicines, if you have severe pain. Ice or heat therapy. Steroid injections in the area of the herniated disk. These can help reduce pain and swelling. Physical therapy  to strengthen your back muscles. In many cases, symptoms go away with treatment over a period of days or weeks. You will most likely be free of symptoms after 3-4 months. If other treatments do not help to relieve your symptoms, you may need surgery. Follow these instructions at home: Medicines Take over-the-counter and prescription medicines only as told by your health  care provider. Ask your health care provider if the medicine prescribed to you: Requires you to avoid driving or using heavy machinery. Can cause constipation. You may need to take these actions to prevent or treat constipation: Drink enough fluid to keep your urine pale yellow. Take over-the-counter or prescription medicines. Eat foods that are high in fiber, such as beans, whole grains, and fresh fruits and vegetables. Limit foods that are high in fat and processed sugars, such as fried or sweet foods. Managing pain, stiffness, and swelling     If directed, put ice on the painful area. Icing can help to relieve pain. To do this: Put ice in a plastic bag. Place a towel between your skin and the bag. Leave the ice on for 20 minutes, 2-3 times a day. Remove the ice if your skin turns bright red. This is very important. If you cannot feel pain, heat, or cold, you have a greater risk of damage to the area. If directed, apply heat to the painful area as often as told by your health care provider. Heat can reduce the stiffness of your muscles. Use the heat source that your health care provider recommends, such as a moist heat pack or a heating pad. Place a towel between your skin and the heat source. Leave the heat on for 20-30 minutes. Remove the heat if your skin turns bright red. This is especially important if you are unable to feel pain, heat, or cold. You may have a greater risk of getting burned. Activity Avoid strict bed rest but only do activities that are painless. Gradually return to your normal activities and exercise as told by your health care provider. Ask your health care provider what activities and exercises are safe for you. Use good posture. Avoid movements that cause pain. Do not lift anything that is heavier than 10 lb (4.5 kg), or the limit that you are told, until your health care provider says that it is safe. Do not sit or stand for long periods of time without  changing positions. Do not sit for long periods of time without getting up and moving around. If physical therapy was prescribed, do exercises as told by your health care provider. Aim to strengthen muscles in your back and abdomen with exercises such as swimming or walking. General instructions Do not use any products that contain nicotine or tobacco, such as cigarettes, e-cigarettes, and chewing tobacco. These products can delay healing. If you need help quitting, ask your health care provider. Do not wear high-heeled shoes. Do not sleep on your abdomen. If you are overweight, work with your health care provider to lose weight safely. Keep all follow-up visits. This is important. How is this prevented? Maintain a healthy weight. Maintain physical fitness. Do at least 150 minutes of moderate-intensity exercise each week, such as brisk walking or water aerobics. When lifting objects: Keep your feet at least shoulder-width apart and tighten the muscles of your abdomen. Keep your spine neutral as you bend your knees and hips. It is important to lift using the strength of your legs, not your back. Do not lock your knees  straight out. Always ask for help to lift heavy or awkward objects. Contact a health care provider if you: Have back pain or neck pain that does not get better after 6 weeks. Have severe pain in your back, neck, legs, or arms. Develop numbness, tingling, or weakness anywhere in your body. Get help right away if: You cannot move your arms or legs. You cannot control when you urinate or have bowel movements. You feel dizzy or you faint. You have shortness of breath. These symptoms may represent a serious problem that is an emergency. Do not wait to see if the symptoms will go away. Get medical help right away. Call your local emergency services (911 in the [place] Do not drive yourself to the hospital. Summary A herniated disk, also called a ruptured disk or slipped disk, occurs  when a disk in the spine bulges out too far (herniates). This condition may be caused by age-related wear and tear or a sudden injury. Symptoms may vary depending on where your herniated disk is located. Treatment may include rest, medicines, ice or heat therapy, steroid injections, and physical therapy. If other treatments do not help to relieve your symptoms, you may need surgery. This information is not intended to replace advice given to you by your health care provider. Make sure you discuss any questions you have with your health care provider. Document Revised: 05/29/2019 Document Reviewed: 05/29/2019 Elsevier Patient Education  2023 [employer].

## 2022-02-09 NOTE — Progress Notes (Signed)
Subjective  CC:  Chief Complaint  Patient presents with   Leg Pain    Pt stated that she has been having Lt leg pain since after thanksgiving.    HPI: Linda Huff is a 51 y.o. female who presents to the office today to address the problems listed above in the chief complaint. 51 year old female presents with improving but persistent left leg pain.  She reports that in late November, her husband was working on their automobile when [employer] gave way the car landed on him.  She lifted the car up to try to help and felt immediate left-sided low back pain and experienced left leg weakness.  Fortunately, she was able to get help from her sons and they were able to rescue her husband who fortunately was not seriously injured.  However, patient had weeks of severe radicular left leg pain and back pain.  She had leftover narcotic pain medicine which was not helpful.  She also used high-dose ibuprofen with minimal success.  She had so much pain she was not sleeping well so eventually went to the emergency room.  I reviewed this note from December 12.  She was treated with Toradol, prednisone Dosepak and Lidoderm patch.  Fortunately she has responded well to these things.  Her pain is now mild to moderate and very intermittent.  She has no weakness.  No bowel or bladder problems.  She has no history of back pain.  She does have an appointment scheduled with the spine specialist in early November.  She has an upcoming road trip she is worried that the pain could return. Reviewed ER notes, lower extremity Doppler reports and pain medications.   Assessment  1. Subacute right lumbar radiculopathy      Plan  Subacute left lumbar radiculopathy: Fortunately, recovering well.  Education given.  May continue ibuprofen as needed.  She has completed a prednisone Dosepak.  She may use Lidoderm patches if needed.  Will add gabapentin 300 mg nightly and increase to twice daily or 3 times daily dosing if needed.   Education on possible side effects discussed.  Discussed preventing further pain during her road trip by positioning and lumbar support.  She does have a TENS unit.  She will follow-up with spine center if not improving.  Follow up: As scheduled for CPE 05/03/2022  No orders of the defined types were placed in this encounter.  Meds ordered this encounter  Medications   gabapentin (NEURONTIN) 300 MG capsule    Sig: Take 1 capsule (300 mg total) by mouth 2 (two) times daily.    Dispense:  60 capsule    Refill:  0      I reviewed the patients updated PMH, FH, and SocHx.    Patient Active Problem List   Diagnosis Date Noted   History of colectomy 04/29/2021   Colon polyp 04/29/2021   Diverticulosis 04/24/2019   History of diverticulitis of colon 04/24/2019   Bradycardia 06/17/2014   Current Meds  Medication Sig   ammonium lactate (LAC-HYDRIN) 12 % lotion 1 application   gabapentin (NEURONTIN) 300 MG capsule Take 1 capsule (300 mg total) by mouth 2 (two) times daily.   lidocaine (LIDODERM) 5 % Place 1 patch onto the skin daily. Remove & Discard patch within 12 hours or as directed by MD   predniSONE (DELTASONE) 20 MG tablet 3 tabs po day one, then 2 po daily x 4 days   Probiotic Product (PROBIOTIC DAILY PO) Take 1 capsule  by mouth daily.    Allergies: Patient has No Known Allergies. Family History: Patient family history includes Alcohol abuse in her father; Arthritis in her mother; Healthy in her daughter, son, and son; High blood pressure in her mother; Hyperlipidemia in her mother; Lung cancer in her paternal grandfather; Osteoporosis in her maternal grandmother; Stomach cancer in her maternal grandfather. Social History:  Patient  reports that she has never smoked. She has never used smokeless tobacco. She reports current alcohol use of about 3.0 standard drinks of alcohol per week. She reports that she does not use drugs.  Review of Systems: Constitutional: Negative for  fever malaise or anorexia Cardiovascular: negative for chest pain Respiratory: negative for SOB or persistent cough Gastrointestinal: negative for abdominal pain  Objective  Vitals: BP 130/80   Pulse 60   Temp 98.3 F (36.8 C)   Ht 5\' 4"  (1.626 m)   Wt 150 lb 12.8 oz (68.4 kg)   LMP 01/08/2022 (Approximate)   SpO2 98%   BMI 25.88 kg/m  General: no acute distress , A&Ox3, moving well. Back: No spinal tenderness, negative straight leg raise bilaterally, +2 ankle DTRs, normal lower extremity strength on left.  Normal gait   Commons side effects, risks, benefits, and alternatives for medications and treatment plan prescribed today were discussed, and the patient expressed understanding of the given instructions. Patient is instructed to call or message via MyChart if he/she has any questions or concerns regarding our treatment plan. No barriers to understanding were identified. We discussed Red Flag symptoms and signs in detail. Patient expressed understanding regarding what to do in case of urgent or emergency type symptoms.  Medication list was reconciled, printed and provided to the patient in AVS. Patient instructions and summary information was reviewed with the patient as documented in the AVS. This note was prepared with assistance of Dragon voice recognition software. Occasional wrong-word or sound-a-like substitutions may have occurred due to the inherent limitations of voice recognition software  This visit occurred during the SARS-CoV-2 public health emergency.  Safety protocols were in place, including screening questions prior to the visit, additional usage of staff PPE, and extensive cleaning of exam room while observing appropriate contact time as indicated for disinfecting solutions.

## 2022-05-03 ENCOUNTER — Encounter: Payer: BC Managed Care – PPO | Admitting: Family Medicine

## 2022-06-28 DIAGNOSIS — D225 Melanocytic nevi of trunk: Secondary | ICD-10-CM | POA: Diagnosis not present

## 2022-06-28 DIAGNOSIS — L578 Other skin changes due to chronic exposure to nonionizing radiation: Secondary | ICD-10-CM | POA: Diagnosis not present

## 2022-06-28 DIAGNOSIS — L814 Other melanin hyperpigmentation: Secondary | ICD-10-CM | POA: Diagnosis not present

## 2022-06-28 DIAGNOSIS — L821 Other seborrheic keratosis: Secondary | ICD-10-CM | POA: Diagnosis not present

## 2022-07-13 DIAGNOSIS — M9905 Segmental and somatic dysfunction of pelvic region: Secondary | ICD-10-CM | POA: Diagnosis not present

## 2022-07-13 DIAGNOSIS — M9906 Segmental and somatic dysfunction of lower extremity: Secondary | ICD-10-CM | POA: Diagnosis not present

## 2022-07-13 DIAGNOSIS — M9903 Segmental and somatic dysfunction of lumbar region: Secondary | ICD-10-CM | POA: Diagnosis not present

## 2022-07-13 DIAGNOSIS — M7662 Achilles tendinitis, left leg: Secondary | ICD-10-CM | POA: Diagnosis not present

## 2022-07-21 DIAGNOSIS — S86892A Other injury of other muscle(s) and tendon(s) at lower leg level, left leg, initial encounter: Secondary | ICD-10-CM | POA: Diagnosis not present

## 2022-07-21 DIAGNOSIS — M9906 Segmental and somatic dysfunction of lower extremity: Secondary | ICD-10-CM | POA: Diagnosis not present

## 2022-07-21 DIAGNOSIS — M9903 Segmental and somatic dysfunction of lumbar region: Secondary | ICD-10-CM | POA: Diagnosis not present

## 2022-07-21 DIAGNOSIS — M79662 Pain in left lower leg: Secondary | ICD-10-CM | POA: Diagnosis not present

## 2022-07-21 DIAGNOSIS — M9905 Segmental and somatic dysfunction of pelvic region: Secondary | ICD-10-CM | POA: Diagnosis not present

## 2022-07-25 DIAGNOSIS — M9905 Segmental and somatic dysfunction of pelvic region: Secondary | ICD-10-CM | POA: Diagnosis not present

## 2022-07-25 DIAGNOSIS — M9903 Segmental and somatic dysfunction of lumbar region: Secondary | ICD-10-CM | POA: Diagnosis not present

## 2022-07-25 DIAGNOSIS — M7662 Achilles tendinitis, left leg: Secondary | ICD-10-CM | POA: Diagnosis not present

## 2022-07-25 DIAGNOSIS — M9906 Segmental and somatic dysfunction of lower extremity: Secondary | ICD-10-CM | POA: Diagnosis not present

## 2022-07-28 DIAGNOSIS — M7662 Achilles tendinitis, left leg: Secondary | ICD-10-CM | POA: Diagnosis not present

## 2022-07-28 DIAGNOSIS — M9903 Segmental and somatic dysfunction of lumbar region: Secondary | ICD-10-CM | POA: Diagnosis not present

## 2022-07-28 DIAGNOSIS — M9906 Segmental and somatic dysfunction of lower extremity: Secondary | ICD-10-CM | POA: Diagnosis not present

## 2022-07-28 DIAGNOSIS — M9905 Segmental and somatic dysfunction of pelvic region: Secondary | ICD-10-CM | POA: Diagnosis not present

## 2022-08-04 DIAGNOSIS — M9906 Segmental and somatic dysfunction of lower extremity: Secondary | ICD-10-CM | POA: Diagnosis not present

## 2022-08-04 DIAGNOSIS — M9905 Segmental and somatic dysfunction of pelvic region: Secondary | ICD-10-CM | POA: Diagnosis not present

## 2022-08-04 DIAGNOSIS — M7662 Achilles tendinitis, left leg: Secondary | ICD-10-CM | POA: Diagnosis not present

## 2022-08-04 DIAGNOSIS — M9903 Segmental and somatic dysfunction of lumbar region: Secondary | ICD-10-CM | POA: Diagnosis not present

## 2022-08-11 LAB — HM MAMMOGRAPHY: HM Mammogram: NORMAL (ref 0–4)

## 2022-08-16 DIAGNOSIS — M9903 Segmental and somatic dysfunction of lumbar region: Secondary | ICD-10-CM | POA: Diagnosis not present

## 2022-08-16 DIAGNOSIS — M7662 Achilles tendinitis, left leg: Secondary | ICD-10-CM | POA: Diagnosis not present

## 2022-08-16 DIAGNOSIS — M9905 Segmental and somatic dysfunction of pelvic region: Secondary | ICD-10-CM | POA: Diagnosis not present

## 2022-08-16 DIAGNOSIS — M9906 Segmental and somatic dysfunction of lower extremity: Secondary | ICD-10-CM | POA: Diagnosis not present

## 2022-08-18 DIAGNOSIS — M9906 Segmental and somatic dysfunction of lower extremity: Secondary | ICD-10-CM | POA: Diagnosis not present

## 2022-08-18 DIAGNOSIS — M9902 Segmental and somatic dysfunction of thoracic region: Secondary | ICD-10-CM | POA: Diagnosis not present

## 2022-08-18 DIAGNOSIS — M9905 Segmental and somatic dysfunction of pelvic region: Secondary | ICD-10-CM | POA: Diagnosis not present

## 2022-08-18 DIAGNOSIS — M9903 Segmental and somatic dysfunction of lumbar region: Secondary | ICD-10-CM | POA: Diagnosis not present

## 2022-09-01 DIAGNOSIS — Z6826 Body mass index (BMI) 26.0-26.9, adult: Secondary | ICD-10-CM | POA: Diagnosis not present

## 2022-09-01 DIAGNOSIS — Z1231 Encounter for screening mammogram for malignant neoplasm of breast: Secondary | ICD-10-CM | POA: Diagnosis not present

## 2022-09-01 DIAGNOSIS — Z01419 Encounter for gynecological examination (general) (routine) without abnormal findings: Secondary | ICD-10-CM | POA: Diagnosis not present

## 2022-09-01 DIAGNOSIS — Z124 Encounter for screening for malignant neoplasm of cervix: Secondary | ICD-10-CM | POA: Diagnosis not present

## 2022-09-02 LAB — HM PAP SMEAR

## 2022-09-06 ENCOUNTER — Other Ambulatory Visit: Payer: Self-pay | Admitting: Obstetrics and Gynecology

## 2022-09-06 DIAGNOSIS — R928 Other abnormal and inconclusive findings on diagnostic imaging of breast: Secondary | ICD-10-CM

## 2022-09-12 ENCOUNTER — Other Ambulatory Visit: Payer: Self-pay | Admitting: Obstetrics and Gynecology

## 2022-09-12 ENCOUNTER — Ambulatory Visit
Admission: RE | Admit: 2022-09-12 | Discharge: 2022-09-12 | Disposition: A | Discharge status: Home / Self Care | Payer: BC Managed Care – PPO | Source: Ambulatory Visit | Attending: Obstetrics and Gynecology | Admitting: Obstetrics and Gynecology

## 2022-09-12 ENCOUNTER — Ambulatory Visit
Admission: RE | Admit: 2022-09-12 | Discharge: 2022-09-12 | Disposition: A | Discharge status: Home / Self Care | Payer: Commercial Managed Care - PPO | Source: Ambulatory Visit | Attending: Obstetrics and Gynecology | Admitting: Obstetrics and Gynecology

## 2022-09-12 DIAGNOSIS — R928 Other abnormal and inconclusive findings on diagnostic imaging of breast: Secondary | ICD-10-CM

## 2022-09-12 DIAGNOSIS — N6325 Unspecified lump in the left breast, overlapping quadrants: Secondary | ICD-10-CM | POA: Diagnosis not present

## 2022-09-14 DIAGNOSIS — Z13228 Encounter for screening for other metabolic disorders: Secondary | ICD-10-CM | POA: Diagnosis not present

## 2022-09-14 DIAGNOSIS — Z131 Encounter for screening for diabetes mellitus: Secondary | ICD-10-CM | POA: Diagnosis not present

## 2022-09-14 DIAGNOSIS — Z1329 Encounter for screening for other suspected endocrine disorder: Secondary | ICD-10-CM | POA: Diagnosis not present

## 2022-09-14 DIAGNOSIS — Z130 Encounter for screening for diseases of the blood and blood-forming organs and certain disorders involving the immune mechanism: Secondary | ICD-10-CM | POA: Diagnosis not present

## 2022-09-14 DIAGNOSIS — Z13220 Encounter for screening for lipoid disorders: Secondary | ICD-10-CM | POA: Diagnosis not present

## 2022-09-16 ENCOUNTER — Ambulatory Visit
Admission: RE | Admit: 2022-09-16 | Discharge: 2022-09-16 | Disposition: A | Discharge status: Home / Self Care | Payer: BC Managed Care – PPO | Source: Ambulatory Visit | Attending: Obstetrics and Gynecology | Admitting: Obstetrics and Gynecology

## 2022-09-16 DIAGNOSIS — N6325 Unspecified lump in the left breast, overlapping quadrants: Secondary | ICD-10-CM | POA: Diagnosis not present

## 2022-09-16 DIAGNOSIS — R928 Other abnormal and inconclusive findings on diagnostic imaging of breast: Secondary | ICD-10-CM

## 2022-09-16 HISTORY — PX: BREAST BIOPSY: SHX20

## 2022-10-11 ENCOUNTER — Other Ambulatory Visit: Payer: Self-pay | Admitting: Family Medicine

## 2022-12-23 DIAGNOSIS — L2089 Other atopic dermatitis: Secondary | ICD-10-CM | POA: Diagnosis not present

## 2023-02-02 ENCOUNTER — Encounter: Payer: BC Managed Care – PPO | Admitting: Family Medicine

## 2023-03-06 ENCOUNTER — Encounter: Payer: Self-pay | Admitting: Family Medicine

## 2023-03-06 ENCOUNTER — Ambulatory Visit: Payer: BC Managed Care – PPO | Admitting: Family Medicine

## 2023-03-06 VITALS — BP 118/82 | HR 50 | Temp 98.1°F | Ht 64.0 in | Wt 143.8 lb

## 2023-03-06 DIAGNOSIS — L309 Dermatitis, unspecified: Secondary | ICD-10-CM | POA: Diagnosis not present

## 2023-03-06 DIAGNOSIS — K635 Polyp of colon: Secondary | ICD-10-CM | POA: Diagnosis not present

## 2023-03-06 DIAGNOSIS — Z1159 Encounter for screening for other viral diseases: Secondary | ICD-10-CM | POA: Diagnosis not present

## 2023-03-06 DIAGNOSIS — Z0000 Encounter for general adult medical examination without abnormal findings: Secondary | ICD-10-CM | POA: Diagnosis not present

## 2023-03-06 DIAGNOSIS — Z23 Encounter for immunization: Secondary | ICD-10-CM | POA: Diagnosis not present

## 2023-03-06 LAB — CBC WITH DIFFERENTIAL/PLATELET
Basophils Absolute: 0 10*3/uL (ref 0.0–0.1)
Basophils Relative: 0.8 % (ref 0.0–3.0)
Eosinophils Absolute: 0.1 10*3/uL (ref 0.0–0.7)
Eosinophils Relative: 2.2 % (ref 0.0–5.0)
HCT: 41.2 % (ref 36.0–46.0)
Hemoglobin: 13.3 g/dL (ref 12.0–15.0)
Lymphocytes Relative: 28.7 % (ref 12.0–46.0)
Lymphs Abs: 1.4 10*3/uL (ref 0.7–4.0)
MCHC: 32.2 g/dL (ref 30.0–36.0)
MCV: 77.1 fL — ABNORMAL LOW (ref 78.0–100.0)
Monocytes Absolute: 0.4 10*3/uL (ref 0.1–1.0)
Monocytes Relative: 8.5 % (ref 3.0–12.0)
Neutro Abs: 2.9 10*3/uL (ref 1.4–7.7)
Neutrophils Relative %: 59.8 % (ref 43.0–77.0)
Platelets: 274 10*3/uL (ref 150.0–400.0)
RBC: 5.34 Mil/uL — ABNORMAL HIGH (ref 3.87–5.11)
RDW: 20.3 % — ABNORMAL HIGH (ref 11.5–15.5)
WBC: 4.9 10*3/uL (ref 4.0–10.5)

## 2023-03-06 LAB — SEDIMENTATION RATE: Sed Rate: 20 mm/h (ref 0–30)

## 2023-03-06 NOTE — Progress Notes (Signed)
 Subjective  Chief Complaint  Patient presents with   Annual Exam    HPI: Linda Huff is a 53 y.o. female who presents to [facility] [place] today for a Female Wellness Visit. She also has the concerns and/or needs as listed above in the chief complaint. These will be addressed in addition to the Health Maintenance Visit.   Wellness Visit: annual visit with health maintenance review and exam  HM: sees Dr. Mat. Had mammo and pap in 2024. Need records. Doing well. Colonoscopy due in march. Eligible for shingrix  and flu. Defers flu for now. Chronic disease f/u and/or acute problem visit: (deemed necessary to be done in addition to the wellness visit): Dermatitis: has rash, ongoing x almost 1 year. Red and dry w/o itching. Prednisone  and topical steroids w/o relief. Has seen derm. No systemic sxs.    Assessment  1. Annual physical exam   2. Need for shingles vaccine   3. Polyp of colon, unspecified part of colon, unspecified type   4. Need for hepatitis C screening test   5. Eczema, unspecified type      Plan  Female Wellness Visit: Age appropriate Health Maintenance and Prevention measures were discussed with patient. Included topics are cancer screening recommendations, ways to keep healthy (see AVS) including dietary and exercise recommendations, regular eye and dental care, use of seat belts, and avoidance of moderate alcohol use and tobacco use. Due for colonoscopy w/ Gi BMI: discussed patient's BMI and encouraged positive lifestyle modifications to help get to or maintain a target BMI. HM needs and immunizations were addressed and ordered. See below for orders. See HM and immunization section for updates. Shingrix  today #1, repeat in 6 mo Routine labs and screening tests ordered including cmp, cbc and lipids where appropriate. Discussed recommendations regarding Vit D and calcium supplementation (see AVS)  Chronic disease management visit and/or acute  problem visit: Rec f/u with derm for skin biopsy to better clarify rash. Check labs  Follow up: 12 mo for cpe  Orders Placed This Encounter  Procedures   HM MAMMOGRAPHY   Zoster Recombinant (Shingrix  )   VITAMIN D  25 Hydroxy (Vit-D Deficiency, Fractures)   CBC with Differential/Platelet   Comprehensive metabolic panel   Lipid panel   TSH   Sedimentation rate   ANA   Hepatitis C antibody   HM PAP SMEAR   No orders of the defined types were placed in this encounter.     Body mass index is 24.68 kg/m. Wt Readings from Last 3 Encounters:  03/06/23 143 lb 12.8 oz (65.2 kg)  02/09/22 150 lb 12.8 oz (68.4 kg)  02/01/22 150 lb (68 kg)     Patient Active Problem List   Diagnosis Date Noted Date Diagnosed   History of colectomy 04/29/2021    Colon polyp 04/29/2021     Colonoscopy 2015 Dr. Lennard, [place] [facility] : path: prolapse type polyp    Diverticulosis 04/24/2019    History of diverticulitis of colon 04/24/2019     Perforated colon 2020    Bradycardia 06/17/2014    Health Maintenance  Topic Date Due   Hepatitis C Screening  Never done   Colonoscopy  05/07/2023   COVID-19 Vaccine (3 - 2024-25 season) 03/22/2023 (Originally 10/23/2022)   Zoster Vaccines- Shingrix  (2 of 2) 05/01/2023   MAMMOGRAM  08/10/2024   Cervical Cancer Screening (HPV/Pap Cotest)  08/10/2025   DTaP/Tdap/Td (3 - Td or Tdap) 04/23/2029   HIV Screening  Completed  HPV VACCINES  Aged Out   INFLUENZA VACCINE  Discontinued   Immunization History  Administered Date(s) Administered   Influenza,inj,Quad PF,6+ Mos 12/24/2014, 11/08/2018   Influenza-Unspecified 11/01/2018   PFIZER(Purple Top)SARS-COV-2 Vaccination 05/16/2019, 06/10/2019   Tdap 02/22/2011, 04/24/2019   Zoster Recombinant(Shingrix ) 03/06/2023   We updated and reviewed the patient's past history in detail and it is documented below. Allergies: Patient has no known allergies. Past Medical History Patient  has a past medical history of Colon  polyp (04/29/2021), Dysrhythmia, Hypertension (2002), Perforation of sigmoid colon due to diverticulitis (08/22/2018), and Status post exploratory laparotomy (08/21/2018). Past Surgical History Patient  has a past surgical history that includes Wisdom tooth extraction; laparotomy (N/A, 08/21/2018); Ileostomy closure (11/14/2018); Ileostomy closure (N/A, 11/14/2018); and Breast biopsy (Left, 09/16/2022). Family History: Patient family history includes Alcohol abuse in her father; Arthritis in her mother; Healthy in her daughter, son, and son; High blood pressure in her mother; Hyperlipidemia in her mother; Lung cancer in her paternal grandfather; Osteoporosis in her maternal grandmother; Stomach cancer in her maternal grandfather. Social History:  Patient  reports that she has never smoked. She has never used smokeless tobacco. She reports current alcohol use of about 3.0 standard drinks of alcohol per week. She reports that she does not use drugs.  Review of Systems: Constitutional: negative for fever or malaise Ophthalmic: negative for photophobia, double vision or loss of vision Cardiovascular: negative for chest pain, dyspnea on exertion, or new LE swelling Respiratory: negative for SOB or persistent cough Gastrointestinal: negative for abdominal pain, change in bowel habits or melena Genitourinary: negative for dysuria or gross hematuria, no abnormal uterine bleeding or disharge Musculoskeletal: negative for new gait disturbance or muscular weakness Integumentary: negative for new or persistent rashes, no breast lumps Neurological: negative for TIA or stroke symptoms Psychiatric: negative for SI or delusions Allergic/Immunologic: negative for hives  Patient Care Team    Relationship Specialty Notifications Start End  Jodie Lavern CROME, MD PCP - General Family Medicine  04/24/19   Rosalynn LELON Ingle, MD (Inactive) Consulting Physician Obstetrics and Gynecology  04/24/19   [place], Tawni CROME, MD  Consulting Physician Dermatology  04/24/19   Belinda Cough, MD Consulting Physician General Surgery  04/24/19   Lennard Lesta FALCON, MD Consulting Physician Gastroenterology  04/24/19   Lavona Agent, MD Consulting Physician Cardiology  04/24/19   Mat Browning, MD Consulting Physician Obstetrics and Gynecology  03/06/23     Objective  Vitals: BP 118/82   Pulse (!) 50   Temp 98.1 F (36.7 C)   Ht 5' 4 (1.626 m)   Wt 143 lb 12.8 oz (65.2 kg)   SpO2 98%   BMI 24.68 kg/m  General:  Well developed, well nourished, no acute distress  Psych:  Alert and orientedx3,normal mood and affect HEENT:  Normocephalic, atraumatic, non-icteric sclera,  supple neck without adenopathy, mass or thyromegaly Cardiovascular:  Normal S1, S2, RRR without gallop, rub or murmur Respiratory:  Good breath sounds bilaterally, CTAB with normal respiratory effort Gastrointestinal: normal bowel sounds, soft, non-tender, no noted masses. No HSM MSK: extremities without edema, joints without erythema or swelling Skin: annular macules on legs and arms Neurologic:    Mental status is normal.  Gross motor and sensory exams are normal.  No tremor  Commons side effects, risks, benefits, and alternatives for medications and treatment plan prescribed today were discussed, and the patient expressed understanding of the given instructions. Patient is instructed to call or message via MyChart if he/she has any questions  or concerns regarding our treatment plan. No barriers to understanding were identified. We discussed Red Flag symptoms and signs in detail. Patient expressed understanding regarding what to do in case of urgent or emergency type symptoms.  Medication list was reconciled, printed and provided to the patient in AVS. Patient instructions and summary information was reviewed with the patient as documented in the AVS. This note was prepared with assistance of Dragon voice recognition software. Occasional wrong-word or  sound-a-like substitutions may have occurred due to the inherent limitations of voice recognition software

## 2023-03-07 LAB — COMPREHENSIVE METABOLIC PANEL
ALT: 18 U/L (ref 0–35)
AST: 24 U/L (ref 0–37)
Albumin: 4.9 g/dL (ref 3.5–5.2)
Alkaline Phosphatase: 67 U/L (ref 39–117)
BUN: 17 mg/dL (ref 6–23)
CO2: 30 meq/L (ref 19–32)
Calcium: 9.9 mg/dL (ref 8.4–10.5)
Chloride: 101 meq/L (ref 96–112)
Creatinine, Ser: 0.79 mg/dL (ref 0.40–1.20)
GFR: 87.19 mL/min (ref >60.00)
Glucose, Bld: 89 mg/dL (ref 70–99)
Potassium: 4.6 meq/L (ref 3.5–5.1)
Sodium: 140 meq/L (ref 135–145)
Total Bilirubin: 1 mg/dL (ref 0.2–1.2)
Total Protein: 7.7 g/dL (ref 6.0–8.3)

## 2023-03-07 LAB — LIPID PANEL
Cholesterol: 165 mg/dL (ref 0–200)
HDL: 73.4 mg/dL (ref >39.00)
LDL Cholesterol: 78 mg/dL (ref 0–99)
NonHDL: 91.7
Total CHOL/HDL Ratio: 2
Triglycerides: 67 mg/dL (ref 0.0–149.0)
VLDL: 13.4 mg/dL (ref 0.0–40.0)

## 2023-03-07 LAB — VITAMIN D 25 HYDROXY (VIT D DEFICIENCY, FRACTURES): VITD: 82.75 ng/mL (ref 30.00–100.00)

## 2023-03-07 LAB — TSH: TSH: 2.18 u[IU]/mL (ref 0.35–5.50)

## 2023-03-08 LAB — ANTI-NUCLEAR AB-TITER (ANA TITER): ANA Titer 1: 1:320 {titer} — ABNORMAL HIGH

## 2023-03-08 LAB — HEPATITIS C ANTIBODY: Hepatitis C Ab: NONREACTIVE

## 2023-03-08 LAB — ANA: Anti Nuclear Antibody (ANA): POSITIVE — AB

## 2023-03-09 ENCOUNTER — Encounter: Payer: Self-pay | Admitting: Family Medicine

## 2023-03-09 DIAGNOSIS — R768 Other specified abnormal immunological findings in serum: Secondary | ICD-10-CM | POA: Insufficient documentation

## 2023-03-09 NOTE — Progress Notes (Signed)
See mychart note Dear Ms. Linda Huff, Your lab results show some positive antibodies that are sometimes consistent with autoimmune conditions. Your rash could be indicative of one of these. I recommend follow up with dermatology for the biopsy as we discussed and then we may want to get you to a rheumatologist depending upon the results.  All of your other labs look good! Sincerely, Dr. Mardelle Matte

## 2023-03-16 ENCOUNTER — Encounter: Payer: Self-pay | Admitting: Family Medicine

## 2023-03-16 ENCOUNTER — Ambulatory Visit: Payer: BC Managed Care – PPO | Admitting: Family Medicine

## 2023-03-16 VITALS — BP 126/85 | HR 53 | Temp 97.8°F | Ht 64.0 in | Wt 145.8 lb

## 2023-03-16 DIAGNOSIS — L309 Dermatitis, unspecified: Secondary | ICD-10-CM | POA: Diagnosis not present

## 2023-03-16 DIAGNOSIS — R21 Rash and other nonspecific skin eruption: Secondary | ICD-10-CM | POA: Diagnosis not present

## 2023-03-16 NOTE — Progress Notes (Signed)
Punch Biopsy Procedure Note  Pre-operative Diagnosis: rash, diagnostic biopsy  Post-operative Diagnosis: same  Locations:diffuse rash on upper and lower extremities, took biopsy from right medial upper calf  Indications: diagnostic  Anesthesia: Lidocaine 2% with epinephrine without added sodium bicarbonate  Procedure Details  History of allergy to iodine: no Patient informed of the risks (including bleeding and infection) and benefits of the  procedure and Verbal informed consent obtained.  The lesion and surrounding area was given a sterile prep using betadyne and draped in the usual sterile fashion. The skin was then stretched perpendicular to the skin tension lines and the lesion removed using the 4mm punch. The resulting ellipse was then closed. The wound was closed with 5-0 ethilon using 2 x simple interrupted stitches. Antibiotic ointment and a sterile dressing applied. The specimen was sent for pathologic examination. The patient tolerated the procedure well.  EBL: 0 ml  Findings: Punch biopsy  Condition: Stable  Complications: none.  Plan: 1. Instructed to keep the wound dry and covered for 24-48h and clean thereafter. 2. Warning signs of infection were reviewed.   3. Recommended that the patient use OTC acetaminophen as needed for pain.  4. Return for suture removal in 7 days.

## 2023-03-16 NOTE — Patient Instructions (Addendum)
Please follow up in 1 week for suture removal.   Skin Biopsy, Care After The following information offers guidance on how to care for yourself after your procedure. Your health care provider may also give you more specific instructions. If you have problems or questions, contact your health care provider. What can I expect after the procedure? After the procedure, it is common to have: Soreness or mild pain. Bruising. Itching. Some redness and swelling. Follow these instructions at home: Biopsy site care  Follow instructions from your health care provider about how to take care of your biopsy site. Make sure you: Wash your hands with soap and water for at least 20 seconds before and after you change your bandage (dressing). If soap and water are not available, use hand sanitizer. Change your dressing as told by your health care provider. Leave stitches (sutures), skin glue, or adhesive strips in place. These skin closures may need to stay in place for 2 weeks or longer. If adhesive strip edges start to loosen and curl up, you may trim the loose edges. Do not remove adhesive strips completely unless your health care provider tells you to do that. Check your biopsy site every day for signs of infection. Check for: More redness, swelling, or pain. Fluid or blood. Warmth. Pus or a bad smell. Do not take baths, swim, or use a hot tub until your health care provider approves. Ask your health care provider if you may take showers. You may only be allowed to take sponge baths. General instructions Take over-the-counter and prescription medicines only as told by your health care provider. Return to your normal activities as told by your health care provider. Ask your health care provider what activities are safe for you. Keep all follow-up visits. This is important. Contact a health care provider if: You have more redness, swelling, or pain around your biopsy site. You have fluid or blood coming  from your biopsy site. Your biopsy site feels warm to the touch. You have pus or a bad smell coming from your biopsy site. You have a fever. Your sutures, skin glue, or adhesive strips loosen or come off sooner than expected. Get help right away if: You have bleeding that does not stop with pressure or a dressing. Summary After the procedure, it is common to have soreness, bruising, and itching at the site. Follow instructions from your health care provider about how to take care of your biopsy site. Check your biopsy site every day for signs of infection. Contact a health care provider if you have more redness, swelling, or pain around your biopsy site, or your biopsy site feels warm to the touch. Keep all follow-up visits. This is important. This information is not intended to replace advice given to you by your health care provider. Make sure you discuss any questions you have with your health care provider. Document Revised: 09/08/2020 Document Reviewed: 09/08/2020 Elsevier Patient Education  2024 [employer].

## 2023-03-27 ENCOUNTER — Ambulatory Visit: Payer: BC Managed Care – PPO | Admitting: Family Medicine

## 2023-03-27 LAB — DERMATOLOGY PATHOLOGY

## 2023-03-30 ENCOUNTER — Encounter: Payer: Self-pay | Admitting: Family Medicine

## 2023-04-13 DIAGNOSIS — L2089 Other atopic dermatitis: Secondary | ICD-10-CM | POA: Diagnosis not present

## 2023-04-13 DIAGNOSIS — Z889 Allergy status to unspecified drugs, medicaments and biological substances status: Secondary | ICD-10-CM | POA: Diagnosis not present

## 2023-05-10 ENCOUNTER — Telehealth: Payer: Self-pay | Admitting: Family Medicine

## 2023-05-10 NOTE — Telephone Encounter (Unsigned)
 Copied from CRM [id number]. Topic: Referral - Request for Referral >> May 10, 2023 10:41 AM Marica Otter wrote: Did the patient discuss referral with their provider in the last year? Yes (If No - schedule appointment) (If Yes - send message)  Appointment offered? No  Type of order/referral and detailed reason for visit: Colonoscopy  Preference of office, provider, location: N/A  If referral order, have you been seen by this specialty before? Yes, (If Yes, this issue or another issue? When? Where? [facility] 10 years ago, doesn't have to go back to him   Can we respond through MyChart? Yes

## 2023-06-14 ENCOUNTER — Telehealth: Payer: Self-pay | Admitting: Gastroenterology

## 2023-06-14 NOTE — Telephone Encounter (Signed)
 Good morning Dr. Nandigam  The following patient is being referred to us  for a colonoscopy. She had her last procedure 5 years ago at [facility] and had a bad experience that led to her having to get an ileostomy bag and she does not want to go to them again. Records have been requested from [facility] to be reviewed.

## 2023-06-14 NOTE — Telephone Encounter (Signed)
 [facility] called, stating patient needs to sign medical release, called and left voicemail for patient to call back and either sign medical release at their office or our office.

## 2023-06-21 NOTE — Telephone Encounter (Signed)
 Patient has been advised. Stated she will stop by to sign form as soon as she can

## 2023-08-09 ENCOUNTER — Ambulatory Visit: Admitting: Gastroenterology

## 2023-08-09 ENCOUNTER — Encounter: Payer: Self-pay | Admitting: Gastroenterology

## 2023-08-09 VITALS — BP 124/78 | HR 57 | Ht 64.0 in | Wt 138.5 lb

## 2023-08-09 DIAGNOSIS — Z860109 Personal history of other colon polyps: Secondary | ICD-10-CM | POA: Diagnosis not present

## 2023-08-09 DIAGNOSIS — Z8719 Personal history of other diseases of the digestive system: Secondary | ICD-10-CM | POA: Diagnosis not present

## 2023-08-09 DIAGNOSIS — Z09 Encounter for follow-up examination after completed treatment for conditions other than malignant neoplasm: Secondary | ICD-10-CM | POA: Diagnosis not present

## 2023-08-09 DIAGNOSIS — Z1211 Encounter for screening for malignant neoplasm of colon: Secondary | ICD-10-CM | POA: Insufficient documentation

## 2023-08-09 MED ORDER — NA SULFATE-K SULFATE-MG SULF 17.5-3.13-1.6 GM/177ML PO SOLN: 1.0000 | Freq: Once | ORAL | 0 refills | Status: AC

## 2023-08-09 NOTE — Patient Instructions (Addendum)
 You have been scheduled for a colonoscopy. Please follow written instructions given to you at your visit today.   If you use inhalers (even only as needed), please bring them with you on the day of your procedure.  DO NOT TAKE 7 DAYS PRIOR TO TEST- Trulicity (dulaglutide) Ozempic, Wegovy (semaglutide) Mounjaro (tirzepatide) Bydureon Bcise (exanatide extended release)  DO NOT TAKE 1 DAY PRIOR TO YOUR TEST Rybelsus (semaglutide) Adlyxin (lixisenatide) Victoza (liraglutide) Byetta (exanatide)  _______________________________________________________  If your blood pressure at your visit was 140/90 or greater, please contact your primary care physician to follow up on this.  _______________________________________________________  If you are age 30 or older, your body mass index should be between 23-30. Your Body mass index is 23.77 kg/m. If this is out of the aforementioned range listed, please consider follow up with your Primary Care Provider.  If you are age 20 or younger, your body mass index should be between 19-25. Your Body mass index is 23.77 kg/m. If this is out of the aformentioned range listed, please consider follow up with your Primary Care Provider.   ________________________________________________________  The [facility] providers would like to encourage you to use MYCHART to communicate with providers for non-urgent requests or questions.  Due to long hold times on the telephone, sending your provider a message by [facility] may be a faster and more efficient way to get a response.  Please allow 48 business hours for a response.  Please remember that this is for non-urgent requests.  _______________________________________________________

## 2023-08-09 NOTE — Progress Notes (Signed)
 08/09/2023 RAECHAL RABEN [id number] 1970-11-25   HISTORY OF PRESENT ILLNESS: This is a 53 year old female who is new to our office.  She is referred here by PCP for colon cancer screening with colonoscopy.  Colonoscopy March 2015 at [facility] showed diverticulosis in the sigmoid colon and 1 diminutive polyp that was removed was a prolapse type polyp.  Colonoscopy was performed at that time for Hemoccult positive stool that was found incidentally at her GYN office.  Subsequently then in 2020 she ended up with a perforated diverticulitis and had exploratory laparotomy with sigmoid colectomy and loop ileostomy with an incidental appendectomy.  She then 3 months later in September 2020 had a loop ileostomy reversal.  She has not had any issues since that time.  Takes Metamucil.  Moves her bowels regularly.  No rectal bleeding or abdominal pain.   Past Medical History:  Diagnosis Date   Colon polyp 04/29/2021   Colonoscopy 2015 Dr. Elsie Halo, [place] [facility]   Diverticulosis    Dysrhythmia    Bradycardia   Hypertension 2002   Pre-eclampsia   Perforation of sigmoid colon due to diverticulitis 08/22/2018   Status post exploratory laparotomy 08/21/2018   Past Surgical History:  Procedure Laterality Date   BREAST BIOPSY Left 09/16/2022   US  LT BREAST BX W LOC DEV 1ST LESION IMG BX SPEC US  GUIDE 09/16/2022 [facility] MAMMOGRAPHY   ILEOSTOMY CLOSURE  11/14/2018   LOOP ILEOSTOMY REVERSAL    ILEOSTOMY CLOSURE N/A 11/14/2018   Procedure: LOOP ILEOSTOMY REVERSAL;  Surgeon: Dareen Ebbing, MD;  Location: [facility] OR;  Service: General;  Laterality: N/A;   LAPAROTOMY N/A 08/21/2018   Procedure: EXPLORATORY LAPAROTOMY,   SIGMOID  COLECTOMY, LOOP ILEOSTOMY, INCIDENTAL APPENDECTOMY,, APPLICATION OF WOUND VAC;  Surgeon: Dareen Ebbing, MD;  Location: [facility] OR;  Service: General;  Laterality: N/A;   WISDOM TOOTH EXTRACTION      reports that she has never smoked. She has never used smokeless tobacco. She reports current  alcohol use of about 3.0 standard drinks of alcohol per week. She reports that she does not use drugs. family history includes Alcohol abuse in her father; Arthritis in her mother; Colon polyps in her mother; Healthy in her daughter, son, and son; High blood pressure in her mother; Hyperlipidemia in her mother; Lung cancer in her paternal grandfather; Osteoporosis in her maternal grandmother; Stomach cancer in her maternal grandfather. No Known Allergies    Outpatient Encounter Medications as of 08/09/2023  Medication Sig   Multiple Vitamins-Minerals (MULTIVITAMIN ADULTS 50+ PO)    Omega-3 Fatty Acids (FISH OIL) 300 MG CAPS Take by mouth.   Probiotic Product (PROBIOTIC DAILY PO) Take 1 capsule by mouth daily.   psyllium (METAMUCIL) 58.6 % powder Take 1 packet by mouth daily.   No facility-administered encounter medications on file as of 08/09/2023.    REVIEW OF SYSTEMS  : All other systems reviewed and negative except where noted in the History of Present Illness.   PHYSICAL EXAM: BP 124/78 (BP Location: Left Arm, Patient Position: Sitting, Cuff Size: Normal)   Pulse (!) 57   Ht 5' 4 (1.626 m)   Wt 138 lb 8 oz (62.8 kg)   BMI 23.77 kg/m  General: Well developed white female in no acute distress Head: Normocephalic and atraumatic Eyes:  Sclerae anicteric, conjunctiva pink. Ears: Normal auditory acuity Lungs: Clear throughout to auscultation; no W/R/R. Heart: Regular rate and rhythm; no M/R/G. Abdomen: Soft, non-distended.  BS present.  Non-tender.  Scars noted from  previous surgery. Rectal:  Will be done at the time of colonoscopy. Musculoskeletal: Symmetrical with no gross deformities  Skin: No lesions on visible extremities Extremities: No edema  Neurological: Alert oriented x 4, grossly non-focal Psychological:  Alert and cooperative. Normal mood and affect  ASSESSMENT AND PLAN: *Colon cancer screening: Last colonoscopy in 2015 with a prolapse type polyp.  Will schedule for  colonoscopy with Dr. Brice Campi. *History of diverticulitis with perforation and abscess requiring sigmoid colectomy and a loop ileostomy temporarily.  This was in 2020.  No issues since that time.  **The risks, benefits, and alternatives to colonoscopy were discussed with the patient and she consents to proceed.   CC:  Luevenia Saha, MD

## 2023-08-12 NOTE — Progress Notes (Signed)
 Attending Physician's Attestation   I have reviewed the chart.   I agree with the Advanced Practitioner's note, impression, and recommendations with any updates as below.    Corliss Parish, MD [facility] Advanced Endoscopy Office # [phone]

## 2023-08-29 ENCOUNTER — Encounter: Payer: Self-pay | Admitting: Gastroenterology

## 2023-09-05 ENCOUNTER — Ambulatory Visit: Admitting: Gastroenterology

## 2023-09-05 ENCOUNTER — Encounter: Payer: Self-pay | Admitting: Gastroenterology

## 2023-09-05 VITALS — BP 118/70 | HR 42 | Temp 98.2°F | Resp 10 | Ht 64.0 in | Wt 138.0 lb

## 2023-09-05 DIAGNOSIS — Z980 Intestinal bypass and anastomosis status: Secondary | ICD-10-CM

## 2023-09-05 DIAGNOSIS — Z1211 Encounter for screening for malignant neoplasm of colon: Secondary | ICD-10-CM | POA: Diagnosis present

## 2023-09-05 DIAGNOSIS — K5730 Diverticulosis of large intestine without perforation or abscess without bleeding: Secondary | ICD-10-CM

## 2023-09-05 DIAGNOSIS — K644 Residual hemorrhoidal skin tags: Secondary | ICD-10-CM | POA: Diagnosis not present

## 2023-09-05 DIAGNOSIS — K648 Other hemorrhoids: Secondary | ICD-10-CM | POA: Diagnosis not present

## 2023-09-05 MED ORDER — SODIUM CHLORIDE 0.9 % IV SOLN: 500.0000 mL | Freq: Once | INTRAVENOUS | Status: DC

## 2023-09-05 NOTE — Op Note (Signed)
 [facility] Patient Name: Linda Huff Procedure Date: 09/05/2023 11:09 AM MRN: [id number] Endoscopist: Aloha Finner , MD, [id number] Age: 53 Referring MD:  Date of Birth: [birth year] Gender: Female Account #: [id number] Procedure:                Colonoscopy Indications:              Screening for colorectal malignant neoplasm,                            Incidental history of prior complicated                            diverticulitis status post partial resection Medicines:                Monitored Anesthesia Care Procedure:                Pre-Anesthesia Assessment:                           - Prior to the procedure, a History and Physical                            was performed, and patient medications and                            allergies were reviewed. The patient's tolerance of                            previous anesthesia was also reviewed. The risks                            and benefits of the procedure and the sedation                            options and risks were discussed with the patient.                            All questions were answered, and informed consent                            was obtained. Prior Anticoagulants: The patient has                            taken no anticoagulant or antiplatelet agents. ASA                            Grade Assessment: I - A normal, healthy patient.                            After reviewing the risks and benefits, the patient                            was deemed in satisfactory condition to undergo the  procedure.                           After obtaining informed consent, the colonoscope                            was passed under direct vision. Throughout the                            procedure, the patient's blood pressure, pulse, and                            oxygen saturations were monitored continuously. The                            Olympus Scope SN [id number] was  introduced through the                            anus and advanced to the the cecum, identified by                            palpation. The colonoscopy was performed without                            difficulty. The patient tolerated the procedure.                            The quality of the bowel preparation was good. The                            terminal ileum, ileocecal valve, appendiceal                            orifice, and rectum were photographed. Scope In: 11:19:05 AM Scope Out: 11:29:36 AM Scope Withdrawal Time: 0 hours 8 minutes 26 seconds  Total Procedure Duration: 0 hours 10 minutes 31 seconds  Findings:                 The digital rectal exam findings include                            hemorrhoids. Pertinent negatives include no                            palpable rectal lesions.                           The terminal ileum and ileocecal valve appeared                            normal.                           There was evidence of a prior end-to-side  colo-colonic anastomosis in the recto-sigmoid colon                            (20 cm from anal os). This was patent and was                            characterized by healthy appearing mucosa. The                            anastomosis was traversed.                           Multiple small-mouthed diverticula were found in                            the remaining left colon.                           Normal mucosa was found in the entire colon                            otherwise.                           Non-bleeding non-thrombosed internal hemorrhoids                            were found during retroflexion, during perianal                            exam and during digital exam. The hemorrhoids were                            Grade II (internal hemorrhoids that prolapse but                            reduce spontaneously). Complications:            No immediate  complications. Estimated Blood Loss:     Estimated blood loss was minimal. Estimated blood                            loss: none. Impression:               - Hemorrhoids found on digital rectal exam.                           - The examined portion of the ileum was normal.                           - Patent end-to-side colo-colonic anastomosis,                            characterized by healthy appearing mucosa found 20  cm from anal os.                           - Multiple diverticula noted in the remaining left                            colon.                           - Normal mucosa in the entire examined colon                            otherwise.                           - Non-bleeding non-thrombosed internal hemorrhoids. Recommendation:           - The patient will be observed post-procedure,                            until all discharge criteria are met.                           - Discharge patient to home.                           - Patient has a contact number available for                            emergencies. The signs and symptoms of potential                            delayed complications were discussed with the                            patient. Return to normal activities tomorrow.                            Written discharge instructions were provided to the                            patient.                           - High fiber diet.                           - Use FiberCon 1-2 tablets PO daily.                           - Continue present medications.                           - Await pathology results.                           - Repeat colonoscopy in 10 years for screening  purposes.                           - The findings and recommendations were discussed                            with the patient.                           - The findings and recommendations were discussed                             with the patient's family. Aloha Finner, MD 09/05/2023 11:34:15 AM

## 2023-09-05 NOTE — Progress Notes (Signed)
 GASTROENTEROLOGY PROCEDURE H&P NOTE   Primary Care Physician: Jodie Lavern CROME, MD  HPI: Linda Huff is a 53 y.o. female who presents for Colonoscopy for screening.  Past Medical History:  Diagnosis Date   Colon polyp 04/29/2021   Colonoscopy 2015 Dr. Lennard, [place] [facility]   Diverticulosis    Dysrhythmia    Bradycardia   Hypertension 2002   Pre-eclampsia   Perforation of sigmoid colon due to diverticulitis 08/22/2018   Status post exploratory laparotomy 08/21/2018   Past Surgical History:  Procedure Laterality Date   BREAST BIOPSY Left 09/16/2022   US  LT BREAST BX W LOC DEV 1ST LESION IMG BX SPEC US  GUIDE 09/16/2022 [facility] MAMMOGRAPHY   ILEOSTOMY CLOSURE  11/14/2018   LOOP ILEOSTOMY REVERSAL    ILEOSTOMY CLOSURE N/A 11/14/2018   Procedure: LOOP ILEOSTOMY REVERSAL;  Surgeon: Belinda Cough, MD;  Location: [facility] OR;  Service: General;  Laterality: N/A;   LAPAROTOMY N/A 08/21/2018   Procedure: EXPLORATORY LAPAROTOMY,   SIGMOID  COLECTOMY, LOOP ILEOSTOMY, INCIDENTAL APPENDECTOMY,, APPLICATION OF WOUND VAC;  Surgeon: Belinda Cough, MD;  Location: [facility] OR;  Service: General;  Laterality: N/A;   WISDOM TOOTH EXTRACTION     Current Outpatient Medications  Medication Sig Dispense Refill   Multiple Vitamins-Minerals (MULTIVITAMIN ADULTS 50+ PO)      Omega-3 Fatty Acids (FISH OIL) 300 MG CAPS Take by mouth.     Probiotic Product (PROBIOTIC DAILY PO) Take 1 capsule by mouth daily.     psyllium (METAMUCIL) 58.6 % powder Take 1 packet by mouth daily.     No current facility-administered medications for this visit.    Current Outpatient Medications:    Multiple Vitamins-Minerals (MULTIVITAMIN ADULTS 50+ PO), , Disp: , Rfl:    Omega-3 Fatty Acids (FISH OIL) 300 MG CAPS, Take by mouth., Disp: , Rfl:    Probiotic Product (PROBIOTIC DAILY PO), Take 1 capsule by mouth daily., Disp: , Rfl:    psyllium (METAMUCIL) 58.6 % powder, Take 1 packet by mouth daily., Disp: , Rfl:  No Known  Allergies Family History  Problem Relation Age of Onset   Hyperlipidemia Mother    Arthritis Mother    High blood pressure Mother    Colon polyps Mother    Alcohol abuse Father    Osteoporosis Maternal Grandmother    Stomach cancer Maternal Grandfather        smoker   Lung cancer Paternal Grandfather    Healthy Daughter    Healthy Son    Healthy Son    Stroke Neg Hx    Diabetes Neg Hx    Colon cancer Neg Hx    Social History   Socioeconomic History   Marital status: Married    Spouse name: Redell   Number of children: 3   Years of education: Not on file   Highest education level: Not on file  Occupational History   Occupation: Investment banker, corporate; 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  Tobacco Use   Smoking status: Never   Smokeless tobacco: Never  Vaping Use   Vaping status: Never Used  Substance and Sexual Activity   Alcohol use: Yes    Alcohol/week: 3.0 standard drinks of alcohol    Types: 3 Standard drinks or equivalent per week    Comment: 3-4 glasses wine/beer per week   Drug use: No   Sexual activity: Yes    Birth control/protection: Other-see comments    Comment: husband had vasectomy  Other Topics Concern   Not on file  Social History  Narrative   Not on file   Social Drivers of Health   Financial Resource Strain: Low Risk  (08/22/2018)   Overall Financial Resource Strain (CARDIA)    Difficulty of Paying Living Expenses: Not hard at all  Food Insecurity: No Food Insecurity (08/22/2018)   Hunger Vital Sign    Worried About Running Out of Food in the Last Year: Never true    Ran Out of Food in the Last Year: Never true  Transportation Needs: No Transportation Needs (08/22/2018)   PRAPARE - Administrator, Civil Service (Medical): No    Lack of Transportation (Non-Medical): No  Physical Activity: Insufficiently Active (08/22/2018)   Exercise Vital Sign    Days of Exercise per Week: 3 days    Minutes of Exercise per Session: 30 min  Stress: No Stress Concern Present  (08/22/2018)   [employer] of Occupational Health - Occupational Stress Questionnaire    Feeling of Stress : Not at all  Social Connections: Moderately Integrated (08/22/2018)   Social Connection and Isolation Panel    Frequency of Communication with Friends and Family: More than three times a week    Frequency of Social Gatherings with Friends and Family: More than three times a week    Attends Religious Services: More than 4 times per year    Active Member of [employer] or Organizations: No    Attends Banker Meetings: Never    Marital Status: Married  Catering manager Violence: Not At Risk (08/22/2018)   Humiliation, Afraid, Rape, and Kick questionnaire    Fear of Current or Ex-Partner: No    Emotionally Abused: No    Physically Abused: No    Sexually Abused: No    Physical Exam: There were no vitals filed for this visit. There is no height or weight on file to calculate BMI. GEN: NAD EYE: Sclerae anicteric ENT: MMM CV: Non-tachycardic GI: Soft, NT/ND NEURO:  Alert & Oriented x 3  Lab Results: No results for input(s): WBC, HGB, HCT, PLT in the last 72 hours. BMET No results for input(s): NA, K, CL, CO2, GLUCOSE, BUN, CREATININE, CALCIUM in the last 72 hours. LFT No results for input(s): PROT, ALBUMIN, AST, ALT, ALKPHOS, BILITOT, BILIDIR, IBILI in the last 72 hours. PT/INR No results for input(s): LABPROT, INR in the last 72 hours.   Impression / Plan: This is a 53 y.o.female who presents for Colonoscopy for screening.  The risks and benefits of endoscopic evaluation/treatment were discussed with the patient and/or family; these include but are not limited to the risk of perforation, infection, bleeding, missed lesions, lack of diagnosis, severe illness requiring hospitalization, as well as anesthesia and sedation related illnesses.  The patient's history has been reviewed, patient examined, no change in status, and  deemed stable for procedure.  The patient and/or family is agreeable to proceed.    Aloha Finner, MD [facility] Advanced Endoscopy Office # [phone]

## 2023-09-05 NOTE — Patient Instructions (Signed)

## 2023-09-05 NOTE — Progress Notes (Signed)
 Pt's states no medical or surgical changes since previsit or office visit.

## 2023-09-05 NOTE — Progress Notes (Signed)
 Report to PACU, RN, vss, BBS= Clear.

## 2023-09-06 ENCOUNTER — Telehealth: Payer: Self-pay | Admitting: Lactation Services

## 2023-09-06 NOTE — Telephone Encounter (Signed)
 No answer left voice mail

## 2023-09-28 ENCOUNTER — Other Ambulatory Visit: Payer: Self-pay | Admitting: Obstetrics and Gynecology

## 2023-09-28 DIAGNOSIS — N6320 Unspecified lump in the left breast, unspecified quadrant: Secondary | ICD-10-CM

## 2023-10-04 ENCOUNTER — Ambulatory Visit
Admission: RE | Admit: 2023-10-04 | Discharge: 2023-10-04 | Disposition: A | Discharge status: Home / Self Care | Source: Ambulatory Visit | Attending: Obstetrics and Gynecology | Admitting: Obstetrics and Gynecology

## 2023-10-04 ENCOUNTER — Ambulatory Visit

## 2023-10-04 DIAGNOSIS — N6320 Unspecified lump in the left breast, unspecified quadrant: Secondary | ICD-10-CM

## 2023-11-26 ENCOUNTER — Encounter (HOSPITAL_COMMUNITY): Payer: Self-pay

## 2023-11-26 ENCOUNTER — Ambulatory Visit (INDEPENDENT_AMBULATORY_CARE_PROVIDER_SITE_OTHER)

## 2023-11-26 ENCOUNTER — Ambulatory Visit (HOSPITAL_COMMUNITY)
Admission: EM | Admit: 2023-11-26 | Discharge: 2023-11-26 | Disposition: A | Discharge status: Home / Self Care | Attending: Nurse Practitioner | Admitting: Nurse Practitioner

## 2023-11-26 DIAGNOSIS — S2232XA Fracture of one rib, left side, initial encounter for closed fracture: Secondary | ICD-10-CM

## 2023-11-26 DIAGNOSIS — R0789 Other chest pain: Secondary | ICD-10-CM

## 2023-11-26 DIAGNOSIS — W19XXXA Unspecified fall, initial encounter: Secondary | ICD-10-CM | POA: Diagnosis not present

## 2023-11-26 NOTE — ED Provider Notes (Signed)
 [facility]    CSN: [id number] Arrival date & time: 11/26/23  1648      History   Chief Complaint Chief Complaint  Patient presents with   Fall   Rib Injury    HPI Linda Huff is a 53 y.o. female.   Patient presents today for left-sided rib cage pain after fall a couple of days ago.  Reports she was getting out of the bathtub when she fell, her left side landing on the edge of the bathtub.  Reports since that time, she has had pain in her left rib cage when she takes a deep breath and yesterday was feeling popping when she was breathing in and out.  No bruising or swelling that she knows of.  No shortness of breath, chest pain or tightness.  No gasping for air.  Patient is concerned about elevated blood pressure today.  Reports she checks her blood pressure at home it normally runs 140s over 90s.  Reports at doctors visits, has been trending up and is wondering if she needs to follow-up with her primary care provider.  No vision changes, headache, blurred vision, double vision, lightheadedness or dizziness.    Past Medical History:  Diagnosis Date   Colon polyp 04/29/2021   Colonoscopy 2015 Dr. Lennard, [facility]   Diverticulosis    Dysrhythmia    Bradycardia   Hypertension 2002   Pre-eclampsia   Perforation of sigmoid colon due to diverticulitis 08/22/2018   Status post exploratory laparotomy 08/21/2018    Patient Active Problem List   Diagnosis Date Noted   Screening for colon cancer 08/09/2023   ANA positive 03/09/2023   History of colectomy 04/29/2021   Colon polyp 04/29/2021   Diverticulosis 04/24/2019   History of diverticulitis of colon 04/24/2019   Bradycardia 06/17/2014    Past Surgical History:  Procedure Laterality Date   BREAST BIOPSY Left 09/16/2022   US  LT BREAST BX W LOC DEV 1ST LESION IMG BX SPEC US  GUIDE 09/16/2022 [facility]   ILEOSTOMY CLOSURE  11/14/2018   LOOP ILEOSTOMY REVERSAL    ILEOSTOMY CLOSURE N/A 11/14/2018    Procedure: LOOP ILEOSTOMY REVERSAL;  Surgeon: Belinda Cough, MD;  Location: [facility] OR;  Service: General;  Laterality: N/A;   LAPAROTOMY N/A 08/21/2018   Procedure: EXPLORATORY LAPAROTOMY,   SIGMOID  COLECTOMY, LOOP ILEOSTOMY, INCIDENTAL APPENDECTOMY,, APPLICATION OF WOUND VAC;  Surgeon: Belinda Cough, MD;  Location: [facility] OR;  Service: General;  Laterality: N/A;   WISDOM TOOTH EXTRACTION      OB History   No obstetric history on file.      Home Medications    Prior to Admission medications   Medication Sig Start Date End Date Taking? Authorizing Provider  Multiple Vitamins-Minerals (MULTIVITAMIN ADULTS 50+ PO)  04/22/22   [provider]  Omega-3 Fatty Acids (FISH OIL) 300 MG CAPS Take by mouth.    [provider]  Probiotic Product (PROBIOTIC DAILY PO) Take 1 capsule by mouth daily.    [provider]  psyllium (METAMUCIL) 58.6 % powder Take 1 packet by mouth daily.    [provider]  saccharomyces boulardii (FLORASTOR) 250 MG capsule as directed Orally Patient not taking: Reported on 11/26/2023    [provider]    Family History Family History  Problem Relation Age of Onset   Hyperlipidemia Mother    Arthritis Mother    High blood pressure Mother    Colon polyps Mother    Alcohol abuse Father  Osteoporosis Maternal Grandmother    Stomach cancer Maternal Grandfather        smoker   Lung cancer Paternal Grandfather    Healthy Daughter    Healthy Son    Healthy Son    Stroke Neg Hx    Diabetes Neg Hx    Colon cancer Neg Hx     Social History Social History   Tobacco Use   Smoking status: Never   Smokeless tobacco: Never  Vaping Use   Vaping status: Never Used  Substance Use Topics   Alcohol use: Yes    Alcohol/week: 3.0 standard drinks of alcohol    Types: 3 Standard drinks or equivalent per week    Comment: 3-4 glasses wine/beer per week   Drug use: No     Allergies   Patient has no known allergies.   Review of  Systems Review of Systems Per HPI  Physical Exam Triage Vital Signs ED Triage Vitals  Encounter Vitals Group     BP 11/26/23 1734 (!) 170/104     Girls Systolic BP Percentile --      Girls Diastolic BP Percentile --      Boys Systolic BP Percentile --      Boys Diastolic BP Percentile --      Pulse Rate 11/26/23 1734 65     Resp 11/26/23 1734 14     Temp 11/26/23 1734 98.2 F (36.8 C)     Temp Source 11/26/23 1734 Oral     SpO2 11/26/23 1734 100 %     Weight --      Height --      Head Circumference --      Peak Flow --      Pain Score 11/26/23 1733 8     Pain Loc --      Pain Education --      Exclude from Growth Chart --    No data found.  Updated Vital Signs BP (!) 170/104 (BP Location: Right Arm)   Pulse 65   Temp 98.2 F (36.8 C) (Oral)   Resp 14   LMP 01/08/2022 (Approximate)   SpO2 100%   Visual Acuity Right Eye Distance:   Left Eye Distance:   Bilateral Distance:    Right Eye Near:   Left Eye Near:    Bilateral Near:     Physical Exam Vitals and nursing note reviewed.  Constitutional:      General: She is not in acute distress.    Appearance: Normal appearance. She is not toxic-appearing.  HENT:     Head: Normocephalic and atraumatic.     Right Ear: External ear normal.     Left Ear: External ear normal.     Mouth/Throat:     Mouth: Mucous membranes are moist.     Pharynx: Oropharynx is clear.  Cardiovascular:     Rate and Rhythm: Normal rate and regular rhythm.  Pulmonary:     Effort: Pulmonary effort is normal. No respiratory distress.     Breath sounds: Normal breath sounds. No wheezing, rhonchi or rales.  Chest:       Comments: Generalized tenderness left rib cage without bruising, swelling, or redness Musculoskeletal:     Cervical back: Normal range of motion.  Lymphadenopathy:     Cervical: No cervical adenopathy.  Skin:    General: Skin is warm and dry.     Coloration: Skin is not jaundiced or pale.     Findings: No bruising  or erythema.  Neurological:     Mental Status: She is alert and oriented to person, place, and time.  Psychiatric:        Behavior: Behavior is cooperative.      UC Treatments / Results  Labs (all labs ordered are listed, but only abnormal results are displayed) Labs Reviewed - No data to display  EKG   Radiology DG Chest 2 View Result Date: 11/26/2023 CLINICAL DATA:  Left rib cage pain.  Recent fall. EXAM: CHEST - 2 VIEW COMPARISON:  None Available. FINDINGS: The cardiomediastinal contours are normal. The lungs are clear. Pulmonary vasculature is normal. No consolidation, pleural effusion, or pneumothorax. Minimally displaced left lateral eighth rib fracture. IMPRESSION: Minimally displaced left lateral eighth rib fracture. No pneumothorax or pulmonary complication. Electronically Signed   By: Andrea Gasman M.D.   On: 11/26/2023 18:27    Procedures Procedures (including critical care time)  Medications Ordered in UC Medications - No data to display  Initial Impression / Assessment and Plan / UC Course  I have reviewed the triage vital signs and the nursing notes.  Pertinent labs & imaging results that were available during my care of the patient were reviewed by me and considered in my medical decision making (see chart for details).   Patient is mildly hypertensive in triage today, otherwise vital signs are stable.  1. Chest wall pain 2. Closed fracture of one rib of left side, initial encounter 3. Fall, initial encounter Chest x-ray today shows minimally displaced left eighth rib fracture We discussed supportive care measures including warm compresses, Tylenol /ibuprofen  as needed for pain Recommended splinting, deep breathing exercises and strict ER precautions discussed  The patient was given the opportunity to ask questions.  All questions answered to their satisfaction.  The patient is in agreement to this plan.   Final Clinical Impressions(s) / UC Diagnoses    Final diagnoses:  Chest wall pain  Closed fracture of one rib of left side, initial encounter  Fall, initial encounter     Discharge Instructions      Your left 8th rib is fractured from the fall.  This will take quite a few weeks to fully heal.  You can take Tylenol  or Motrin  as needed for pain.  You can also use warm compresses to help with pain.  Recommend deep breathing exercises to help prevent pneumonia.    ED Prescriptions   None    PDMP not reviewed this encounter.   Chandra Harlene LABOR, NP 11/26/23 [phone]

## 2023-11-26 NOTE — ED Notes (Signed)
 Per provider instructions, pt encouraged to check her BP at home tonight after she rests/relaxes some; if her BP continues to remain elevated, recommended she f/u with her PCP. Pt verbalized understanding.

## 2023-11-26 NOTE — ED Triage Notes (Addendum)
 Patient reports that she was getting out of the tub 2 days ago and fell, hitting her left rib area. Pain worsens with a deep breath or movement.  Patient states she a Goody powder. Nothing today.

## 2023-11-26 NOTE — Discharge Instructions (Addendum)
 Your left 8th rib is fractured from the fall.  This will take quite a few weeks to fully heal.  You can take Tylenol  or Motrin  as needed for pain.  You can also use warm compresses to help with pain.  Recommend deep breathing exercises to help prevent pneumonia.

## 2023-11-27 ENCOUNTER — Telehealth: Payer: Self-pay

## 2023-11-27 NOTE — Telephone Encounter (Signed)
 Copied from CRM #[id number]. Topic: General - Other >> Nov 27, 2023  1:51 PM Linda Huff wrote: Reason for CRM: Patient would like to report her blood pressure readings. She also slipped and fell Friday, and found out she had a fractured rib yesterday.   12/16/2023 174/100 181/106  11/27/2023 157/98 157/96  Message sent to provider to address

## 2024-03-06 ENCOUNTER — Encounter: Payer: BC Managed Care – PPO | Admitting: Family Medicine
